# Patient Record
Sex: Male | Born: 1975
Health system: Southern US, Community
[De-identification: ages and names within clinical notes are randomized; demographics above are authoritative.]

## PROBLEM LIST (undated history)

## (undated) DIAGNOSIS — I447 Left bundle-branch block, unspecified: Secondary | ICD-10-CM

## (undated) DIAGNOSIS — I5022 Chronic systolic (congestive) heart failure: Secondary | ICD-10-CM

## (undated) DIAGNOSIS — I428 Other cardiomyopathies: Secondary | ICD-10-CM

---

## 2015-06-17 ENCOUNTER — Inpatient Hospital Stay (HOSPITAL_COMMUNITY)
Admission: EM | Admit: 2015-06-17 | Discharge: 2015-06-19 | DRG: 287 | Disposition: A | Discharge status: Home / Self Care | Payer: BLUE CROSS/BLUE SHIELD | Source: Other Acute Inpatient Hospital | Attending: Cardiology | Admitting: Cardiology

## 2015-06-17 DIAGNOSIS — I10 Essential (primary) hypertension: Secondary | ICD-10-CM | POA: Diagnosis present

## 2015-06-17 DIAGNOSIS — Z8249 Family history of ischemic heart disease and other diseases of the circulatory system: Secondary | ICD-10-CM

## 2015-06-17 DIAGNOSIS — F17200 Nicotine dependence, unspecified, uncomplicated: Secondary | ICD-10-CM | POA: Diagnosis present

## 2015-06-17 DIAGNOSIS — I447 Left bundle-branch block, unspecified: Secondary | ICD-10-CM | POA: Diagnosis present

## 2015-06-17 DIAGNOSIS — G4733 Obstructive sleep apnea (adult) (pediatric): Secondary | ICD-10-CM | POA: Diagnosis present

## 2015-06-17 DIAGNOSIS — Z7982 Long term (current) use of aspirin: Secondary | ICD-10-CM | POA: Diagnosis not present

## 2015-06-17 DIAGNOSIS — I248 Other forms of acute ischemic heart disease: Secondary | ICD-10-CM | POA: Diagnosis present

## 2015-06-17 DIAGNOSIS — I5021 Acute systolic (congestive) heart failure: Principal | ICD-10-CM | POA: Diagnosis present

## 2015-06-17 DIAGNOSIS — K219 Gastro-esophageal reflux disease without esophagitis: Secondary | ICD-10-CM | POA: Diagnosis present

## 2015-06-17 DIAGNOSIS — I509 Heart failure, unspecified: Secondary | ICD-10-CM

## 2015-06-17 DIAGNOSIS — Z720 Tobacco use: Secondary | ICD-10-CM

## 2015-06-17 MED ORDER — ASPIRIN EC 81 MG PO TBEC
81.0000 mg | DELAYED_RELEASE_TABLET | Freq: Every day | ORAL | Status: DC
  Administered 2015-06-18: 81 mg via ORAL
  Filled 2015-06-17: qty 1

## 2015-06-17 MED ORDER — FUROSEMIDE 10 MG/ML IJ SOLN
80.0000 mg | Freq: Two times a day (BID) | INTRAMUSCULAR | Status: DC
  Administered 2015-06-18 – 2015-06-19 (×3): 80 mg via INTRAVENOUS
  Filled 2015-06-17 (×3): qty 8

## 2015-06-17 MED ORDER — HEPARIN SODIUM (PORCINE) 5000 UNIT/ML IJ SOLN
5000.0000 [IU] | Freq: Three times a day (TID) | INTRAMUSCULAR | Status: DC
  Administered 2015-06-18 – 2015-06-19 (×3): 5000 [IU] via SUBCUTANEOUS
  Filled 2015-06-17 (×4): qty 1

## 2015-06-17 MED ORDER — NITROGLYCERIN 0.4 MG SL SUBL: 0.4000 mg | SUBLINGUAL_TABLET | SUBLINGUAL | Status: DC | PRN

## 2015-06-17 MED ORDER — POTASSIUM CHLORIDE CRYS ER 20 MEQ PO TBCR
40.0000 meq | EXTENDED_RELEASE_TABLET | Freq: Two times a day (BID) | ORAL | Status: DC
  Administered 2015-06-18 – 2015-06-19 (×4): 40 meq via ORAL
  Filled 2015-06-17 (×4): qty 2

## 2015-06-17 MED ORDER — ONDANSETRON HCL 4 MG/2ML IJ SOLN: 4.0000 mg | Freq: Four times a day (QID) | INTRAMUSCULAR | Status: DC | PRN

## 2015-06-17 MED ORDER — LOSARTAN POTASSIUM 25 MG PO TABS
25.0000 mg | ORAL_TABLET | Freq: Two times a day (BID) | ORAL | Status: DC
  Administered 2015-06-18 – 2015-06-19 (×4): 25 mg via ORAL
  Filled 2015-06-17 (×4): qty 1

## 2015-06-17 MED ORDER — ASPIRIN 81 MG PO CHEW: 324.0000 mg | CHEWABLE_TABLET | ORAL | Status: AC

## 2015-06-17 MED ORDER — ASPIRIN 300 MG RE SUPP: 300.0000 mg | RECTAL | Status: AC

## 2015-06-17 MED ORDER — NITROGLYCERIN IN D5W 200-5 MCG/ML-% IV SOLN: 0.0000 ug/min | INTRAVENOUS | Status: DC

## 2015-06-17 MED ORDER — ACETAMINOPHEN 325 MG PO TABS: 650.0000 mg | ORAL_TABLET | ORAL | Status: DC | PRN

## 2015-06-17 MED ORDER — ATORVASTATIN CALCIUM 40 MG PO TABS
40.0000 mg | ORAL_TABLET | Freq: Every day | ORAL | Status: DC
  Administered 2015-06-18: 40 mg via ORAL
  Filled 2015-06-17: qty 1

## 2015-06-17 NOTE — H&P (Addendum)
Physician History and Physical    Ethan York MRN: [id number] DOB/AGE: [birth year] 39 y.o. Admit date: 06/17/2015  Primary Cardiologist: New  HPI: 39 yo smoker with history of HTN and OSA presents with CHF.   Patient says that he has had exertional dyspnea for about a month.  Initially, he attributed it to "sinuses."  However, symptoms progressively worsened.  Currently, he is short of breath getting dressed or walking about 50 yards.  Sleeps on stomach so not sure about orthopnea but has had a couple of episodes consistent with PND. He has heartburn after large meals but no exertional chest pain.  He has occasional right-sided atypical chest pain.  Leg edema developed a couple of days ago.  He continues to smoke 1 ppd.  His brother (13 yo) is currently in the hospital after an MI.    Review of systems complete and found to be negative unless listed above   PMH: 1. HTN 2. OSA: Has not been using CPAP.  3. GERD  FH: - Brother with MI at 7 - Mother with MI in her 59s  Social History   Social History  . Marital Status: Married    Spouse Name: N/A  . Number of Children: N/A  . Years of Education: N/A   Occupational History  . Not on file.   Social History Main Topics  . Smoking status: 1 ppd  . Smokeless tobacco: Not on file  . Alcohol Use: Rare  . Drug Use: Not on file  . Sexual Activity: Not on file   Other Topics Concern  . Not on file   Social History Narrative  . Games developer, lives in [place].     Current Facility-Administered Medications  Medication Dose Route Frequency Provider Last Rate Last Dose  . acetaminophen (TYLENOL) tablet 650 mg  650 mg Oral Q4H PRN Laurey Morale, MD      . aspirin chewable tablet 324 mg  324 mg Oral NOW Laurey Morale, MD   324 mg at 06/18/15 0000   Or  . aspirin suppository 300 mg  300 mg Rectal NOW Laurey Morale, MD      . aspirin EC tablet 81 mg  81 mg Oral Daily Laurey Morale, MD      . atorvastatin (LIPITOR) tablet  40 mg  40 mg Oral q1800 Laurey Morale, MD      . furosemide (LASIX) injection 80 mg  80 mg Intravenous BID Laurey Morale, MD      . heparin injection 5,000 Units  5,000 Units Subcutaneous 3 times per day Laurey Morale, MD      . losartan (COZAAR) tablet 25 mg  25 mg Oral BID Laurey Morale, MD      . nitroGLYCERIN (NITROSTAT) SL tablet 0.4 mg  0.4 mg Sublingual Q5 Min x 3 PRN Laurey Morale, MD      . nitroGLYCERIN 50 mg in dextrose 5 % 250 mL (0.2 mg/mL) infusion  0-200 mcg/min Intravenous Titrated Laurey Morale, MD      . ondansetron [facility]) injection 4 mg  4 mg Intravenous Q6H PRN Laurey Morale, MD      . potassium chloride SA (K-DUR,KLOR-CON) CR tablet 40 mEq  40 mEq Oral BID Laurey Morale, MD        Physical Exam: Blood pressure 141/103, pulse 100, temperature 97.7 F (36.5 C), temperature source Oral, height  (2.007 m), weight 338 lb 6.5 oz (153.5  kg), SpO2 97 %.  General: NAD Neck: JVP 14-16 cm, no thyromegaly or thyroid nodule.  Lungs: Mild crackles at bases bilaterally. CV: Nondisplaced PMI.  Heart regular S1/S2, no S3/S4, no murmur.  1+ edema to knees bilaterally.  No carotid bruit.  Normal pedal pulses.  Abdomen: Soft, nontender, no hepatosplenomegaly, no distention.  Skin: Intact without lesions or rashes.  Neurologic: Alert and oriented x 3.  Psych: Normal affect. Extremities: No clubbing or cyanosis.  HEENT: Normal.   Labs: nt-proBNP 3330 Na 137 K 3.5 Creatinine 1.06 HCT 40.4   Radiology: - CXR: Mild cardiomegaly  EKG: Sinus tachy, LBBB  ASSESSMENT AND PLAN: 39 yo smoker with history of HTN and OSA presents with CHF.   1. Acute CHF: I am concerned that this is acute systolic CHF.  He is volume overloaded on exam with NYHA class III symptoms. [facility]NP at [facility] was markedly elevated. Etiology uncertain but CAD high on differential.  He has multiple RFs for CAD (family history, HTN, smoking).  He has not had chest pain concerning for ischemia.   He has had long-standing HTN, which could cause a cardiomyopathy.  Cannot rule out viral myocarditis.  - Lasix 80 mg IV bid with KCl 40 bid.  - losartan 25 mg bid.  - NTG gtt at 20 mcg/min, titrate up as able to keep SBP < 130/90.  - Echo  - He will need eventual LHC/RHC. I will diurese him a bit first => possibly on Friday.  2. CAD: Multiple RFs for CAD as above.  No definite ischemic chest pain.  Cannot rule out ischemic cardiomyopathy with decompensated CHF.   - Eventual cath, as above.  - I will cycle troponin to rule out ACS.  - ASA 81 daily and will use atorvastatin for now.  3. OSA: Will provide him with qhs CPAP.  4. HTN: Control BP with NTG gtt and losartan for now.  5. Smoking: I strongly advised him to quit.   Signed: [name] 06/17/2015, 11:59 PM

## 2015-06-18 ENCOUNTER — Ambulatory Visit (HOSPITAL_COMMUNITY): Payer: BLUE CROSS/BLUE SHIELD

## 2015-06-18 ENCOUNTER — Encounter (HOSPITAL_COMMUNITY)
Admission: EM | Disposition: A | Discharge status: Home / Self Care | Payer: Self-pay | Source: Other Acute Inpatient Hospital | Attending: Cardiology

## 2015-06-18 ENCOUNTER — Encounter (HOSPITAL_COMMUNITY): Payer: Self-pay

## 2015-06-18 DIAGNOSIS — I509 Heart failure, unspecified: Secondary | ICD-10-CM

## 2015-06-18 DIAGNOSIS — R0600 Dyspnea, unspecified: Secondary | ICD-10-CM

## 2015-06-18 HISTORY — PX: CARDIAC CATHETERIZATION: SHX172

## 2015-06-18 LAB — LIPID PANEL
CHOLESTEROL: 114 mg/dL (ref 0–200)
HDL: 20 mg/dL — ABNORMAL LOW (ref >40)
LDL CALC: 84 mg/dL (ref 0–99)
TRIGLYCERIDES: 48 mg/dL (ref <150)
Total CHOL/HDL Ratio: 5.7 RATIO
VLDL: 10 mg/dL (ref 0–40)

## 2015-06-18 LAB — TROPONIN I
TROPONIN I: 0.04 ng/mL — AB (ref <0.031)
TROPONIN I: 0.03 ng/mL (ref <0.031)
Troponin I: 0.04 ng/mL — ABNORMAL HIGH (ref <0.031)

## 2015-06-18 LAB — CBC
HEMATOCRIT: 37.9 % — AB (ref 39.0–52.0)
HEMATOCRIT: 39.5 % (ref 39.0–52.0)
HEMOGLOBIN: 12 g/dL — AB (ref 13.0–17.0)
HEMOGLOBIN: 13 g/dL (ref 13.0–17.0)
MCH: 28.7 pg (ref 26.0–34.0)
MCH: 29.8 pg (ref 26.0–34.0)
MCHC: 31.7 g/dL (ref 30.0–36.0)
MCHC: 32.9 g/dL (ref 30.0–36.0)
MCV: 90.6 fL (ref 78.0–100.0)
MCV: 90.7 fL (ref 78.0–100.0)
Platelets: 261 10*3/uL (ref 150–400)
Platelets: 237 10*3/uL (ref 150–400)
RBC: 4.36 MIL/uL (ref 4.22–5.81)
RBC: 4.18 MIL/uL — ABNORMAL LOW (ref 4.22–5.81)
RDW: 14.8 % (ref 11.5–15.5)
RDW: 14.8 % (ref 11.5–15.5)
WBC: 7.3 10*3/uL (ref 4.0–10.5)
WBC: 8.6 10*3/uL (ref 4.0–10.5)

## 2015-06-18 LAB — BASIC METABOLIC PANEL
Anion gap: 7 (ref 5–15)
Anion gap: 9 (ref 5–15)
BUN: 7 mg/dL (ref 6–20)
BUN: 7 mg/dL (ref 6–20)
CALCIUM: 9 mg/dL (ref 8.9–10.3)
CHLORIDE: 102 mmol/L (ref 101–111)
CO2: 28 mmol/L (ref 22–32)
CO2: 29 mmol/L (ref 22–32)
CREATININE: 1.04 mg/dL (ref 0.61–1.24)
CREATININE: 1.1 mg/dL (ref 0.61–1.24)
Calcium: 8.8 mg/dL — ABNORMAL LOW (ref 8.9–10.3)
Chloride: 104 mmol/L (ref 101–111)
GFR calc non Af Amer: >60 mL/min (ref >60)
GFR calc non Af Amer: >60 mL/min (ref >60)
Glucose, Bld: 115 mg/dL — ABNORMAL HIGH (ref 65–99)
Glucose, Bld: 99 mg/dL (ref 65–99)
POTASSIUM: 3.7 mmol/L (ref 3.5–5.1)
Potassium: 3.5 mmol/L (ref 3.5–5.1)
SODIUM: 139 mmol/L (ref 135–145)
Sodium: 140 mmol/L (ref 135–145)

## 2015-06-18 LAB — BRAIN NATRIURETIC PEPTIDE: B Natriuretic Peptide: 884.2 pg/mL — ABNORMAL HIGH (ref 0.0–100.0)

## 2015-06-18 LAB — POCT I-STAT 3, VENOUS BLOOD GAS (G3P V)
Acid-Base Excess: 4 mmol/L — ABNORMAL HIGH (ref 0.0–2.0)
Acid-Base Excess: 5 mmol/L — ABNORMAL HIGH (ref 0.0–2.0)
BICARBONATE: 29.1 meq/L — AB (ref 20.0–24.0)
Bicarbonate: 28.4 mEq/L — ABNORMAL HIGH (ref 20.0–24.0)
O2 SAT: 60 %
O2 Saturation: 63 %
PCO2 VEN: 39.2 mmHg — AB (ref 45.0–50.0)
PH VEN: 7.467 — AB (ref 7.250–7.300)
PH VEN: 7.476 — AB (ref 7.250–7.300)
TCO2: 30 mmol/L (ref 0–100)
TCO2: 30 mmol/L (ref 0–100)
pCO2, Ven: 39.4 mmHg — ABNORMAL LOW (ref 45.0–50.0)
pO2, Ven: 29 mmHg — CL (ref 30.0–45.0)
pO2, Ven: 31 mmHg (ref 30.0–45.0)

## 2015-06-18 LAB — CREATININE, SERUM
Creatinine, Ser: 1.11 mg/dL (ref 0.61–1.24)
GFR calc Af Amer: >60 mL/min (ref >60)
GFR calc non Af Amer: >60 mL/min (ref >60)

## 2015-06-18 LAB — MRSA PCR SCREENING: MRSA by PCR: NEGATIVE

## 2015-06-18 LAB — TSH: TSH: 1.399 u[IU]/mL (ref 0.350–4.500)

## 2015-06-18 SURGERY — RIGHT/LEFT HEART CATH AND CORONARY ANGIOGRAPHY

## 2015-06-18 MED ORDER — SPIRONOLACTONE 25 MG PO TABS
12.5000 mg | ORAL_TABLET | Freq: Every day | ORAL | Status: DC
  Administered 2015-06-18 – 2015-06-19 (×2): 12.5 mg via ORAL
  Filled 2015-06-18 (×2): qty 1

## 2015-06-18 MED ORDER — FENTANYL CITRATE (PF) 100 MCG/2ML IJ SOLN
INTRAMUSCULAR | Status: AC
  Filled 2015-06-18: qty 2

## 2015-06-18 MED ORDER — SODIUM CHLORIDE 0.9 % IV SOLN: 250.0000 mL | INTRAVENOUS | Status: DC | PRN

## 2015-06-18 MED ORDER — PERFLUTREN LIPID MICROSPHERE
1.0000 mL | INTRAVENOUS | Status: AC | PRN
  Administered 2015-06-18: 2 mL via INTRAVENOUS
  Filled 2015-06-18: qty 10

## 2015-06-18 MED ORDER — HEPARIN SODIUM (PORCINE) 1000 UNIT/ML IJ SOLN
INTRAMUSCULAR | Status: DC | PRN
  Administered 2015-06-18: 6000 [IU] via INTRAVENOUS

## 2015-06-18 MED ORDER — SODIUM CHLORIDE 0.9 % IV SOLN
INTRAVENOUS | Status: DC | PRN
  Administered 2015-06-18: 20 mL/h via INTRAVENOUS

## 2015-06-18 MED ORDER — ALPRAZOLAM 0.5 MG PO TABS
0.5000 mg | ORAL_TABLET | Freq: Two times a day (BID) | ORAL | Status: DC | PRN
  Administered 2015-06-18: 0.5 mg via ORAL
  Filled 2015-06-18: qty 1

## 2015-06-18 MED ORDER — HEPARIN (PORCINE) IN NACL 2-0.9 UNIT/ML-% IJ SOLN
INTRAMUSCULAR | Status: AC
  Filled 2015-06-18: qty 1000

## 2015-06-18 MED ORDER — SODIUM CHLORIDE 0.9 % IJ SOLN
3.0000 mL | Freq: Two times a day (BID) | INTRAMUSCULAR | Status: DC
  Administered 2015-06-18 – 2015-06-19 (×3): 3 mL via INTRAVENOUS

## 2015-06-18 MED ORDER — ACETAMINOPHEN 325 MG PO TABS: 650.0000 mg | ORAL_TABLET | ORAL | Status: DC | PRN

## 2015-06-18 MED ORDER — SODIUM CHLORIDE 0.9 % IJ SOLN: 3.0000 mL | INTRAMUSCULAR | Status: DC | PRN

## 2015-06-18 MED ORDER — ONDANSETRON HCL 4 MG/2ML IJ SOLN: 4.0000 mg | Freq: Four times a day (QID) | INTRAMUSCULAR | Status: DC | PRN

## 2015-06-18 MED ORDER — HEPARIN (PORCINE) IN NACL 2-0.9 UNIT/ML-% IJ SOLN
INTRAMUSCULAR | Status: DC | PRN
  Administered 2015-06-18: 12:00:00

## 2015-06-18 MED ORDER — VERAPAMIL HCL 2.5 MG/ML IV SOLN
INTRAVENOUS | Status: AC
  Filled 2015-06-18: qty 2

## 2015-06-18 MED ORDER — IOHEXOL 350 MG/ML SOLN
INTRAVENOUS | Status: DC | PRN
  Administered 2015-06-18: 90 mL via INTRAVENOUS

## 2015-06-18 MED ORDER — ASPIRIN 81 MG PO CHEW: 81.0000 mg | CHEWABLE_TABLET | ORAL | Status: AC

## 2015-06-18 MED ORDER — VERAPAMIL HCL 2.5 MG/ML IV SOLN
INTRAVENOUS | Status: DC | PRN
  Administered 2015-06-18: 10 mL via INTRA_ARTERIAL

## 2015-06-18 MED ORDER — NICOTINE 21 MG/24HR TD PT24
21.0000 mg | MEDICATED_PATCH | Freq: Every day | TRANSDERMAL | Status: DC
  Administered 2015-06-18 – 2015-06-19 (×2): 21 mg via TRANSDERMAL
  Filled 2015-06-18 (×2): qty 1

## 2015-06-18 MED ORDER — MIDAZOLAM HCL 2 MG/2ML IJ SOLN
INTRAMUSCULAR | Status: AC
  Filled 2015-06-18: qty 2

## 2015-06-18 MED ORDER — ATROPINE SULFATE 0.1 MG/ML IJ SOLN
INTRAMUSCULAR | Status: AC
  Filled 2015-06-18: qty 10

## 2015-06-18 MED ORDER — MIDAZOLAM HCL 2 MG/2ML IJ SOLN
INTRAMUSCULAR | Status: DC | PRN
  Administered 2015-06-18: 2 mg via INTRAVENOUS

## 2015-06-18 MED ORDER — FENTANYL CITRATE (PF) 100 MCG/2ML IJ SOLN
INTRAMUSCULAR | Status: DC | PRN
  Administered 2015-06-18: 50 ug via INTRAVENOUS

## 2015-06-18 MED ORDER — SODIUM CHLORIDE 0.9 % IJ SOLN
3.0000 mL | Freq: Two times a day (BID) | INTRAMUSCULAR | Status: DC
  Administered 2015-06-18: 3 mL via INTRAVENOUS

## 2015-06-18 MED ORDER — SODIUM CHLORIDE 0.9 % IV SOLN: INTRAVENOUS | Status: DC

## 2015-06-18 MED ORDER — HEPARIN SODIUM (PORCINE) 1000 UNIT/ML IJ SOLN
INTRAMUSCULAR | Status: AC
  Filled 2015-06-18: qty 1

## 2015-06-18 MED ORDER — LIDOCAINE HCL (PF) 1 % IJ SOLN
INTRAMUSCULAR | Status: AC
  Filled 2015-06-18: qty 30

## 2015-06-18 MED ORDER — LIDOCAINE HCL (PF) 1 % IJ SOLN
INTRAMUSCULAR | Status: DC | PRN
  Administered 2015-06-18: 2 mL
  Administered 2015-06-18: 1 mL

## 2015-06-18 MED ORDER — SODIUM CHLORIDE 0.9 % IJ SOLN
3.0000 mL | Freq: Two times a day (BID) | INTRAMUSCULAR | Status: DC
  Administered 2015-06-19: 3 mL via INTRAVENOUS

## 2015-06-18 SURGICAL SUPPLY — 13 items
CATH BALLN WEDGE 5F 110CM (CATHETERS) ×3 IMPLANT
CATH INFINITI 5 FR 3DRC (CATHETERS) ×3 IMPLANT
CATH INFINITI 5 FR JL3.5 (CATHETERS) ×3 IMPLANT
CATH INFINITI 5FR ANG PIGTAIL (CATHETERS) ×3 IMPLANT
CATH INFINITI JR4 5F (CATHETERS) ×3 IMPLANT
DEVICE RAD COMP TR BAND LRG (VASCULAR PRODUCTS) ×3 IMPLANT
GLIDESHEATH SLEND SS 6F .021 (SHEATH) ×3 IMPLANT
KIT HEART LEFT (KITS) ×3 IMPLANT
PACK CARDIAC CATHETERIZATION (CUSTOM PROCEDURE TRAY) ×3 IMPLANT
SHEATH FAST CATH BRACH 5F 5CM (SHEATH) ×3 IMPLANT
TRANSDUCER W/STOPCOCK (MISCELLANEOUS) ×3 IMPLANT
TUBING CIL FLEX 10 FLL-RA (TUBING) ×3 IMPLANT
WIRE SAFE-T 1.5MM-J .035X260CM (WIRE) ×3 IMPLANT

## 2015-06-18 NOTE — Care Management Note (Signed)
Case Management Note  Patient Details  Name: Ethan York MRN: [id number] Date of Birth: [birth year]  Subjective/Objective:       Adm w heart failure             Action/Plan:lives w fam   Expected Discharge Date:                  Expected Discharge Plan:  Home/Self Care  In-House Referral:     Discharge planning Services     Post Acute Care Choice:    Choice offered to:     DME Arranged:    DME Agency:     HH Arranged:    HH Agency:     Status of Service:     Medicare Important Message Given:    Date Medicare IM Given:    Medicare IM give by:    Date Additional Medicare IM Given:    Additional Medicare Important Message give by:     If discussed at Long Length of Stay Meetings, dates discussed:    Additional Comments: ur review done  Hanley Hays, RN 06/18/2015, 7:31 AM

## 2015-06-18 NOTE — Progress Notes (Signed)
Echocardiogram 2D Echocardiogram with Definity has been performed.  Ethan York 06/18/2015, 4:39 PM

## 2015-06-18 NOTE — Interval H&P Note (Signed)
History and Physical Interval Note:  06/18/2015 11:13 AM  Ethan York  has presented today for surgery, with the diagnosis of cp  The various methods of treatment have been discussed with the patient and family. After consideration of risks, benefits and other options for treatment, the patient has consented to  Procedure(s): Right/Left Heart Cath and Coronary Angiography (N/A) as a surgical intervention .  The patient's history has been reviewed, patient examined, no change in status, stable for surgery.  I have reviewed the patient's chart and labs.  Questions were answered to the patient's satisfaction.     [place] [employer]

## 2015-06-18 NOTE — Progress Notes (Signed)
Patient ID: Ethan York, male   DOB: 10/22/1975, 39 y.o.   MRN: [id number]   SUBJECTIVE: No dyspnea at rest.  He diuresed a lot last night.  Still leg edema.    Scheduled Meds: . aspirin  324 mg Oral NOW   Or  . aspirin  300 mg Rectal NOW  . aspirin EC  81 mg Oral Daily  . atorvastatin  40 mg Oral q1800  . furosemide  80 mg Intravenous BID  . heparin  5,000 Units Subcutaneous 3 times per day  . losartan  25 mg Oral BID  . potassium chloride  40 mEq Oral BID  . spironolactone  12.5 mg Oral Daily   Continuous Infusions: . nitroGLYCERIN     PRN Meds:.acetaminophen, nitroGLYCERIN, ondansetron (ZOFRAN) IV    Filed Vitals:   06/18/15 0300 06/18/15 0322 06/18/15 0400 06/18/15 0600  BP: 117/76 116/82 128/84 130/85  Pulse: 82 87    Temp:  98.1 F (36.7 C)    TempSrc:  Oral    Height:      Weight:  338 lb 6.5 oz (153.5 kg)    SpO2: 96% 93%      Intake/Output Summary (Last 24 hours) at 06/18/15 0728 Last data filed at 06/18/15 0500  Gross per 24 hour  Intake    450 ml  Output   6150 ml  Net  -5700 ml    LABS: Basic Metabolic Panel:  Recent Labs  06/18/15 0040  CREATININE 1.11   Liver Function Tests: No results for input(s): AST, ALT, ALKPHOS, BILITOT, PROT, ALBUMIN in the last 72 hours. No results for input(s): LIPASE, AMYLASE in the last 72 hours. CBC:  Recent Labs  06/18/15 0040 06/18/15 0654  WBC 8.6 7.3  HGB 13.0 12.0*  HCT 39.5 37.9*  MCV 90.6 90.7  PLT 261 237   Cardiac Enzymes:  Recent Labs  06/18/15 0040  TROPONINI 0.04*   BNP: Invalid input(s): POCBNP D-Dimer: No results for input(s): DDIMER in the last 72 hours. Hemoglobin A1C: No results for input(s): HGBA1C in the last 72 hours. Fasting Lipid Panel: No results for input(s): CHOL, HDL, LDLCALC, TRIG, CHOLHDL, LDLDIRECT in the last 72 hours. Thyroid Function Tests:  Recent Labs  06/18/15 0040  TSH 1.399   Anemia Panel: No results for input(s): VITAMINB12, FOLATE, FERRITIN, TIBC,  IRON, RETICCTPCT in the last 72 hours.  RADIOLOGY: No results found.  PHYSICAL EXAM General: NAD Neck: JVP 12 cm, no thyromegaly or thyroid nodule.  Lungs: Clear to auscultation bilaterally with normal respiratory effort. CV: Nondisplaced PMI.  Heart regular S1/S2, no S3/S4, no murmur.  1+ edema 1/2 to knees bilaterally.  No carotid bruit.  Normal pedal pulses.  Abdomen: Soft, nontender, no hepatosplenomegaly, no distention.  Neurologic: Alert and oriented x 3.  Psych: Normal affect. Extremities: No clubbing or cyanosis.   TELEMETRY: Reviewed telemetry pt in NSR with LBBB  ASSESSMENT AND PLAN: 39 yo smoker with history of HTN and OSA presents with CHF.  1. Acute CHF: I am concerned that this is acute systolic CHF. He is volume overloaded on exam with NYHA class III symptoms. NT-proBNP at [facility] was markedly elevated. Etiology uncertain but CAD high on differential. He has multiple RFs for CAD (family history, HTN, smoking). He has not had chest pain concerning for ischemia. He has had long-standing HTN, which could cause a cardiomyopathy. Cannot rule out viral myocarditis. He diuresed well last night but is still volume overloaded. - Continue Lasix 80 mg IV bid with   KCl 40 bid.  - losartan 25 mg bid.  - Add spironolactone 12.5 daily.  - Echo  - Very good diuresis so will see if we can go ahead and do LHC/RHC today.   2. CAD: Multiple RFs for CAD as above. No definite ischemic chest pain. Cannot rule out ischemic cardiomyopathy with decompensated CHF. Mild troponin elevation most likely is demand ischemia from volume overload.  - Coronary angiography, as above.  - ASA 81 daily and will use atorvastatin for now.  3. OSA: Will provide him with qhs CPAP.  4. HTN: BP controlled.  5. Smoking: I strongly advised him to quit.   [name] 06/18/2015 7:34 AM

## 2015-06-18 NOTE — Progress Notes (Signed)
Pt has order for CPAP. I spoke with patient prior to setup and patient did not seem very interested in wearing but patients wife told him he needed to wear. Pt stated he wore nasal mask and was unsure of setting. I brought back CPAP and patient said he was not ready for it at this time. I started him on auto-titration setting Hi: 18/ Lo: 8. Pt currently wearing 2L [place]. I told him to let us know when he was ready because we needed to bleed in oxygen. Pt understands. RT will monitor

## 2015-06-18 NOTE — Progress Notes (Signed)
Pt has CPAP at bedside. Pt is able to put on CPAP when ready. RT will monitor.

## 2015-06-18 NOTE — H&P (View-Only) (Signed)
Patient ID: Ethan York, male   DOB: March 08, 1976, 39 y.o.   MRN: [id number]   SUBJECTIVE: No dyspnea at rest.  He diuresed a lot last night.  Still leg edema.    Scheduled Meds: . aspirin  324 mg Oral NOW   Or  . aspirin  300 mg Rectal NOW  . aspirin EC  81 mg Oral Daily  . atorvastatin  40 mg Oral q1800  . furosemide  80 mg Intravenous BID  . heparin  5,000 Units Subcutaneous 3 times per day  . losartan  25 mg Oral BID  . potassium chloride  40 mEq Oral BID  . spironolactone  12.5 mg Oral Daily   Continuous Infusions: . nitroGLYCERIN     PRN Meds:.acetaminophen, nitroGLYCERIN, ondansetron (ZOFRAN) IV    Filed Vitals:   06/18/15 0300 06/18/15 0322 06/18/15 0400 06/18/15 0600  BP: 117/76 116/82 128/84 130/85  Pulse: 82 87    Temp:  98.1 F (36.7 C)    TempSrc:  Oral    Height:      Weight:  338 lb 6.5 oz (153.5 kg)    SpO2: 96% 93%      Intake/Output Summary (Last 24 hours) at 06/18/15 0728 Last data filed at 06/18/15 0500  Gross per 24 hour  Intake    450 ml  Output   6150 ml  Net  -5700 ml    LABS: Basic Metabolic Panel:  Recent Labs  40/98/11 0040  CREATININE 1.11   Liver Function Tests: No results for input(s): AST, ALT, ALKPHOS, BILITOT, PROT, ALBUMIN in the last 72 hours. No results for input(s): LIPASE, AMYLASE in the last 72 hours. CBC:  Recent Labs  06/18/15 0040 06/18/15 0654  WBC 8.6 7.3  HGB 13.0 12.0*  HCT 39.5 37.9*  MCV 90.6 90.7  PLT 261 237   Cardiac Enzymes:  Recent Labs  06/18/15 0040  TROPONINI 0.04*   BNP: Invalid input(s): POCBNP D-Dimer: No results for input(s): DDIMER in the last 72 hours. Hemoglobin A1C: No results for input(s): HGBA1C in the last 72 hours. Fasting Lipid Panel: No results for input(s): CHOL, HDL, LDLCALC, TRIG, CHOLHDL, LDLDIRECT in the last 72 hours. Thyroid Function Tests:  Recent Labs  06/18/15 0040  TSH 1.399   Anemia Panel: No results for input(s): VITAMINB12, FOLATE, FERRITIN, TIBC,  IRON, RETICCTPCT in the last 72 hours.  RADIOLOGY: No results found.  PHYSICAL EXAM General: NAD Neck: JVP 12 cm, no thyromegaly or thyroid nodule.  Lungs: Clear to auscultation bilaterally with normal respiratory effort. CV: Nondisplaced PMI.  Heart regular S1/S2, no S3/S4, no murmur.  1+ edema 1/2 to knees bilaterally.  No carotid bruit.  Normal pedal pulses.  Abdomen: Soft, nontender, no hepatosplenomegaly, no distention.  Neurologic: Alert and oriented x 3.  Psych: Normal affect. Extremities: No clubbing or cyanosis.   TELEMETRY: Reviewed telemetry pt in NSR with LBBB  ASSESSMENT AND PLAN: 39 yo smoker with history of HTN and OSA presents with CHF.  1. Acute CHF: I am concerned that this is acute systolic CHF. He is volume overloaded on exam with NYHA class III symptoms. NT[facility]proBNP at [facility] was markedly elevated. Etiology uncertain but CAD high on differential. He has multiple RFs for CAD (family history, HTN, smoking). He has not had chest pain concerning for ischemia. He has had long-standing HTN, which could cause a cardiomyopathy. Cannot rule out viral myocarditis. He diuresed well last night but is still volume overloaded. - Continue Lasix 80 mg IV bid with  KCl 40 bid.  - losartan 25 mg bid.  - Add spironolactone 12.5 daily.  - Echo  - Very good diuresis so will see if we can go ahead and do LHC/RHC today.   2. CAD: Multiple RFs for CAD as above. No definite ischemic chest pain. Cannot rule out ischemic cardiomyopathy with decompensated CHF. Mild troponin elevation most likely is demand ischemia from volume overload.  - Coronary angiography, as above.  - ASA 81 daily and will use atorvastatin for now.  3. OSA: Will provide him with qhs CPAP.  4. HTN: BP controlled.  5. Smoking: I strongly advised him to quit.   [name] 06/18/2015 7:34 AM

## 2015-06-19 ENCOUNTER — Encounter (HOSPITAL_COMMUNITY): Payer: Self-pay

## 2015-06-19 DIAGNOSIS — I5021 Acute systolic (congestive) heart failure: Principal | ICD-10-CM

## 2015-06-19 LAB — BASIC METABOLIC PANEL
ANION GAP: 6 (ref 5–15)
BUN: 8 mg/dL (ref 6–20)
CHLORIDE: 97 mmol/L — AB (ref 101–111)
CO2: 34 mmol/L — ABNORMAL HIGH (ref 22–32)
Calcium: 9.2 mg/dL (ref 8.9–10.3)
Creatinine, Ser: 1.19 mg/dL (ref 0.61–1.24)
GFR calc non Af Amer: >60 mL/min (ref >60)
Glucose, Bld: 99 mg/dL (ref 65–99)
POTASSIUM: 3.3 mmol/L — AB (ref 3.5–5.1)
SODIUM: 137 mmol/L (ref 135–145)

## 2015-06-19 LAB — CBC
HCT: 40.1 % (ref 39.0–52.0)
HEMOGLOBIN: 12.7 g/dL — AB (ref 13.0–17.0)
MCH: 28.9 pg (ref 26.0–34.0)
MCHC: 31.7 g/dL (ref 30.0–36.0)
MCV: 91.1 fL (ref 78.0–100.0)
Platelets: 255 10*3/uL (ref 150–400)
RBC: 4.4 MIL/uL (ref 4.22–5.81)
RDW: 14.8 % (ref 11.5–15.5)
WBC: 7.3 10*3/uL (ref 4.0–10.5)

## 2015-06-19 LAB — MAGNESIUM: MAGNESIUM: 2.1 mg/dL (ref 1.7–2.4)

## 2015-06-19 LAB — HEMOGLOBIN A1C
HEMOGLOBIN A1C: 5.6 % (ref 4.8–5.6)
MEAN PLASMA GLUCOSE: 114 mg/dL

## 2015-06-19 MED ORDER — PNEUMOCOCCAL VAC POLYVALENT 25 MCG/0.5ML IJ INJ: 0.5000 mL | INJECTION | INTRAMUSCULAR | Status: DC

## 2015-06-19 MED ORDER — CARVEDILOL 3.125 MG PO TABS
3.1250 mg | ORAL_TABLET | Freq: Two times a day (BID) | ORAL | Status: DC
  Administered 2015-06-19: 3.125 mg via ORAL
  Filled 2015-06-19: qty 1

## 2015-06-19 MED ORDER — INFLUENZA VAC SPLIT QUAD 0.5 ML IM SUSY
0.5000 mL | PREFILLED_SYRINGE | INTRAMUSCULAR | Status: AC
  Administered 2015-06-19: 0.5 mL via INTRAMUSCULAR
  Filled 2015-06-19: qty 0.5

## 2015-06-19 MED ORDER — NICOTINE 21 MG/24HR TD PT24: 21.0000 mg | MEDICATED_PATCH | Freq: Every day | TRANSDERMAL | Status: DC

## 2015-06-19 MED ORDER — LOSARTAN POTASSIUM 25 MG PO TABS: 25.0000 mg | ORAL_TABLET | Freq: Two times a day (BID) | ORAL | Status: DC

## 2015-06-19 MED ORDER — CARVEDILOL 3.125 MG PO TABS: 3.1250 mg | ORAL_TABLET | Freq: Two times a day (BID) | ORAL | Status: DC

## 2015-06-19 MED ORDER — POTASSIUM CHLORIDE CRYS ER 20 MEQ PO TBCR: 20.0000 meq | EXTENDED_RELEASE_TABLET | Freq: Every day | ORAL | Status: DC

## 2015-06-19 MED ORDER — INFLUENZA VAC SPLIT QUAD 0.5 ML IM SUSY: 0.5000 mL | PREFILLED_SYRINGE | INTRAMUSCULAR | Status: DC

## 2015-06-19 MED ORDER — FUROSEMIDE 40 MG PO TABS: 40.0000 mg | ORAL_TABLET | Freq: Two times a day (BID) | ORAL | Status: DC

## 2015-06-19 MED ORDER — PNEUMOCOCCAL VAC POLYVALENT 25 MCG/0.5ML IJ INJ
0.5000 mL | INJECTION | INTRAMUSCULAR | Status: AC
  Administered 2015-06-19: 0.5 mL via INTRAMUSCULAR
  Filled 2015-06-19: qty 0.5

## 2015-06-19 MED ORDER — POTASSIUM CHLORIDE CRYS ER 20 MEQ PO TBCR: 40.0000 meq | EXTENDED_RELEASE_TABLET | Freq: Once | ORAL | Status: DC

## 2015-06-19 MED ORDER — SPIRONOLACTONE 25 MG PO TABS: 12.5000 mg | ORAL_TABLET | Freq: Every day | ORAL | Status: DC

## 2015-06-19 NOTE — Discharge Summary (Signed)
Advanced Heart Failure Team  Discharge Summary   Patient ID: Ethan York MRN: [id number], DOB/AGE: 03-06-1976 39 y.o. Admit date: 06/17/2015 D/C date:     06/19/2015   Primary Discharge Diagnose 1. Acute Systolic Heart Failure  06/17/2016 ECHO EF 20-25% 2. OSA 3. HTN 4. Smoker 5. LBBB   Hospital Course:  Mr Horng is a 39 year old with history of HTN, OSA, and smoker. Strong history of CAD-brother died of MI at 87 and his Mother had MI in her 44s. Admitted with increased dyspnea and chest pain.  ECHO performed and showed reduce EF 20-25%. RHC/LHC performed and showed elevated filling pressures and normal cors. Diuresed with IV lasix and transitioned to po lasix 40 mg twice a day. Overall he diuresed 21 pounds. He was placed on carvedilol, losartan, spironolactone prior to discharge. He was provided medications through the [facility] program.   Prior to admit he smoked 1-2 packs per day. He was counseled to stop smoking and will continue nicoderm patch for now. Plan to follow closely in the HF clinic. Plan to check BMET at first office visit. Will need to refer to HFSW for assistance with insurance.   RHC/LHC 06/18/2015 RA mean 9 RV 48/12 PA 50/29, mean 37 PCWP mean 22 LV 111/30 AO 115/80 Oxygen saturations: PA 62% AO 95% Cardiac Output (Fick) 7.04  Cardiac Index (Fick) 2.47 PVR 2.13 LHC norm cors. Discharge Weight: 317 pounds.  Discharge Vitals: Blood pressure 127/89, pulse 87, temperature 99.2 F (37.3 C), temperature source Oral, resp. rate 18, height 6\' 7"  (2.007 m), weight 317 lb 0.3 oz (143.8 kg), SpO2 97 %.  Labs: Lab Results  Component Value Date   WBC 7.3 06/19/2015   HGB 12.7* 06/19/2015   HCT 40.1 06/19/2015   MCV 91.1 06/19/2015   PLT 255 06/19/2015    Recent Labs Lab 06/19/15 0825  NA 137  K 3.3*  CL 97*  CO2 34*  BUN 8  CREATININE 1.19  CALCIUM 9.2  GLUCOSE 99   Lab Results  Component Value Date   CHOL 114 06/18/2015   HDL 20* 06/18/2015   LDLCALC 84 06/18/2015   TRIG 48 06/18/2015   BNP (last 3 results)  Recent Labs  06/18/15 0040  BNP 884.2*    ProBNP (last 3 results) No results for input(s): PROBNP in the last 8760 hours.   Diagnostic Studies/Procedures   No results found.  Discharge Medications     Medication List    STOP taking these medications        aspirin-sod bicarb-citric acid 325 MG Tbef tablet  Commonly known as:  ALKA-SELTZER     GOODYS BODY PAIN PO     losartan-hydrochlorothiazide 100-12.5 MG tablet  Commonly known as:  HYZAAR      TAKE these medications        carvedilol 3.125 MG tablet  Commonly known as:  COREG  Take 1 tablet (3.125 mg total) by mouth 2 (two) times daily with a meal.     furosemide 40 MG tablet  Commonly known as:  LASIX  Take 1 tablet (40 mg total) by mouth 2 (two) times daily.  Start taking on:  06/20/2015     losartan 25 MG tablet  Commonly known as:  COZAAR  Take 1 tablet (25 mg total) by mouth 2 (two) times daily.     nicotine 21 mg/24hr patch  Commonly known as:  NICODERM CQ - dosed in mg/24 hours  Place 1 patch (21 mg total) onto the  skin daily.     potassium chloride SA 20 MEQ tablet  Commonly known as:  K-DUR,KLOR-CON  Take 1 tablet (20 mEq total) by mouth daily.     spironolactone 25 MG tablet  Commonly known as:  ALDACTONE  Take 0.5 tablets (12.5 mg total) by mouth daily.        Disposition   The patient will be discharged in stable condition to home.     Discharge Instructions    Diet - low sodium heart healthy    Complete by:  As directed      Heart Failure patients record your daily weight using the same scale at the same time of day    Complete by:  As directed      Increase activity slowly    Complete by:  As directed           Follow-up Information    Follow up with [name], MD On 07/03/2015.   Specialty:  Cardiology   Why:  08:40 [address] [id number]    Contact information:   [address] [place]  [place] [address] [phone]         Duration of Discharge Encounter: Greater than 35 minutes   Signed, Aaralynn Shepheard NP-C  06/19/2015, 9:46 AM

## 2015-06-19 NOTE — Progress Notes (Signed)
Tonye Becket NP called about K 3.3. given now and NP ordered additional dose tonight. Patient instructed of this on D/c instructions. Patient with no complaints at the current time. PIVs d/c'd catheter tip intact. Pt taken out to private vehicle with spouse via WC.

## 2015-06-19 NOTE — Care Management Note (Signed)
Case Management Note  Patient Details  Name: Ethan York MRN: [id number] Date of Birth: 01/18/1976  Subjective/Objective:     Adm w heart failure               Action/Plan: lives w wife, pt to follow up w chf clinic. Gave [facility] inform [facility] and [facility]. Gave pt match letter for 30days of meds. No ins or pcp .    Expected Discharge Date:                  Expected Discharge Plan:  Home/Self Care  In-House Referral:     Discharge planning Services  CM Consult, HF Clinic, MATCH Program, Indigent Health Clinic  Post Acute Care Choice:    Choice offered to:     DME Arranged:    DME Agency:     HH Arranged:    HH Agency:     Status of Service:     Medicare Important Message Given:    Date Medicare IM Given:    Medicare IM give by:    Date Additional Medicare IM Given:    Additional Medicare Important Message give by:     If discussed at Long Length of Stay Meetings, dates discussed:    Additional Comments:  Hanley Hays, RN 06/19/2015, 9:37 AM

## 2015-06-19 NOTE — Progress Notes (Signed)
Patient ID: Ethan York, male   DOB: 1975/12/07, 39 y.o.   MRN: [id number]   SUBJECTIVE:  Had cath yesterday and diuresed with IV lasix. Cleda Daub added . Diuresed 6.9 liters. Weight down 21 pounds.   Denies SOB/Orthopnea.   ECHO 06/18/15 EF 20-25%. RV poorly visualized.    RHC RA mean 9 RV 48/12 PA 50/29, mean 37 PCWP mean 22 LV 111/30 AO 115/80 Oxygen saturations: PA 62% AO 95% Cardiac Output (Fick) 7.04  Cardiac Index (Fick) 2.47 PVR 2.13 LHC norm cors.    Scheduled Meds: . aspirin EC  81 mg Oral Daily  . atorvastatin  40 mg Oral q1800  . furosemide  80 mg Intravenous BID  . heparin  5,000 Units Subcutaneous 3 times per day  . Influenza vac split quadrivalent PF  0.5 mL Intramuscular Tomorrow-1000  . losartan  25 mg Oral BID  . nicotine  21 mg Transdermal Daily  . pneumococcal 23 valent vaccine  0.5 mL Intramuscular Tomorrow-1000  . potassium chloride  40 mEq Oral BID  . sodium chloride  3 mL Intravenous Q12H  . sodium chloride  3 mL Intravenous Q12H  . sodium chloride  3 mL Intravenous Q12H  . spironolactone  12.5 mg Oral Daily   Continuous Infusions: . sodium chloride    . nitroGLYCERIN     PRN Meds:.sodium chloride, sodium chloride, sodium chloride, acetaminophen, ALPRAZolam, nitroGLYCERIN, ondansetron (ZOFRAN) IV, sodium chloride, sodium chloride, sodium chloride    Filed Vitals:   06/18/15 1900 06/18/15 2315 06/19/15 0323 06/19/15 0742  BP: 127/89 124/91 124/81 127/89  Pulse: 92  79 87  Temp: 98.1 F (36.7 C) 98 F (36.7 C) 97.7 F (36.5 C) 99.2 F (37.3 C)  TempSrc: Oral Oral Oral Oral  Resp: 16 20 18 18   Height:      Weight:   317 lb 0.3 oz (143.8 kg)   SpO2: 96% 98% 93% 97%    Intake/Output Summary (Last 24 hours) at 06/19/15 0832 Last data filed at 06/19/15 0800  Gross per 24 hour  Intake    930 ml  Output   7250 ml  Net  -6320 ml    LABS: Basic Metabolic Panel:  Recent Labs  03/49/17 0654 06/18/15 1250  NA 139 140  K 3.5 3.7    CL 104 102  CO2 28 29  GLUCOSE 115* 99  BUN 7 7  CREATININE 1.04 1.10  CALCIUM 9.0 8.8*   Liver Function Tests: No results for input(s): AST, ALT, ALKPHOS, BILITOT, PROT, ALBUMIN in the last 72 hours. No results for input(s): LIPASE, AMYLASE in the last 72 hours. CBC:  Recent Labs  06/18/15 0654 06/19/15 0347  WBC 7.3 7.3  HGB 12.0* 12.7*  HCT 37.9* 40.1  MCV 90.7 91.1  PLT 237 255   Cardiac Enzymes:  Recent Labs  06/18/15 0040 06/18/15 0654 06/18/15 1250  TROPONINI 0.04* 0.04* 0.03   BNP: Invalid input(s): POCBNP D-Dimer: No results for input(s): DDIMER in the last 72 hours. Hemoglobin A1C:  Recent Labs  06/18/15 0040  HGBA1C 5.6   Fasting Lipid Panel:  Recent Labs  06/18/15 0654  CHOL 114  HDL 20*  LDLCALC 84  TRIG 48  CHOLHDL 5.7   Thyroid Function Tests:  Recent Labs  06/18/15 0040  TSH 1.399   Anemia Panel: No results for input(s): VITAMINB12, FOLATE, FERRITIN, TIBC, IRON, RETICCTPCT in the last 72 hours.  RADIOLOGY: No results found.  PHYSICAL EXAM General: NAD. Walking in the room.  Neck: JVP  5-6 cm, no thyromegaly or thyroid nodule.  Lungs: Clear to auscultation bilaterally with normal respiratory effort. CV: Nondisplaced PMI.  Heart regular S1/S2, no S3/S4, no murmur.  Edema Trace RLE and 1+ LLE edema.  No carotid bruit.  Normal pedal pulses.  Abdomen: Soft, nontender, no hepatosplenomegaly, no distention.  Neurologic: Alert and oriented x 3.  Psych: Normal affect. Extremities: No clubbing or cyanosis.   TELEMETRY: Reviewed telemetry pt in NSR with LBBB  ASSESSMENT AND PLAN: 39 yo smoker with history of HTN and OSA presents with CHF.  1. Acute systolic CHF: He presented volume overloaded on exam with NYHA class III symptoms. [facility]proBNP at [facility] was markedly elevated. No significant CAD on coronary angiography yesterday, nonischemic cardiomyopathy.  He has had long-standing HTN, which could cause a cardiomyopathy. Cannot  rule out viral myocarditis. Also consider LBBB CMP.  RHC mildly elevated filling pressures.  ECHO EF 20-25%. RV poorly visualized.  He has diuresed excellently, weight down 21 lbs.  Looks euvolemic today. - Stop IV lasix. Start lasix 40 mg twice a day.  - Add carvedilol 3.125 mg twice a day.  -Continue losartan 25 mg bid. Consider entresto as outpatient.  - Continue spironolactone 12.5 daily.  - Check BMET now.  2. CAD: Multiple RFs for CAD as above. LHC normal cors. - Stop ASA and atorvastatin for now.  3. OSA: Will provide him with qhs CPAP.  4. HTN: BP controlled.  5. Smoking: Prior to admit smoking 2 1/2 packs per day. Needs nicotine patch. 6. LBBB  Consult care management for medications prior to admit. Difficulty paying for meds. No insurance.  D/C later today.   Amy Clegg NP-C  06/19/2015 8:32 AM   Patient seen with NP, agree with the above note.  He has diuresed well, doing much better. I think we can convert to po Lasix today.  Home on the above meds.  Needs to quit smoking.  Will need close followup, will see in about 10 days.  Would take next week off work. Will have social work help with insurance, med costs, etc.  [name] 06/19/2015 9:24 AM

## 2015-07-03 ENCOUNTER — Ambulatory Visit (HOSPITAL_COMMUNITY)
Admit: 2015-07-03 | Discharge: 2015-07-03 | Disposition: A | Discharge status: Home / Self Care | Payer: BLUE CROSS/BLUE SHIELD | Source: Ambulatory Visit | Attending: Cardiology | Admitting: Cardiology

## 2015-07-03 ENCOUNTER — Encounter (HOSPITAL_COMMUNITY): Payer: Self-pay | Admitting: *Deleted

## 2015-07-03 VITALS — BP 112/72 | HR 75 | Ht 79.0 in | Wt 309.0 lb

## 2015-07-03 DIAGNOSIS — I5022 Chronic systolic (congestive) heart failure: Secondary | ICD-10-CM | POA: Diagnosis not present

## 2015-07-03 DIAGNOSIS — G4733 Obstructive sleep apnea (adult) (pediatric): Secondary | ICD-10-CM | POA: Insufficient documentation

## 2015-07-03 DIAGNOSIS — I509 Heart failure, unspecified: Secondary | ICD-10-CM

## 2015-07-03 DIAGNOSIS — Z720 Tobacco use: Secondary | ICD-10-CM | POA: Insufficient documentation

## 2015-07-03 LAB — BASIC METABOLIC PANEL
Anion gap: 7 (ref 5–15)
BUN: 23 mg/dL — ABNORMAL HIGH (ref 6–20)
CHLORIDE: 101 mmol/L (ref 101–111)
CO2: 26 mmol/L (ref 22–32)
Calcium: 9.8 mg/dL (ref 8.9–10.3)
Creatinine, Ser: 1.06 mg/dL (ref 0.61–1.24)
GFR calc non Af Amer: >60 mL/min (ref >60)
Glucose, Bld: 110 mg/dL — ABNORMAL HIGH (ref 65–99)
POTASSIUM: 4.3 mmol/L (ref 3.5–5.1)
SODIUM: 134 mmol/L — AB (ref 135–145)

## 2015-07-03 LAB — BRAIN NATRIURETIC PEPTIDE: B NATRIURETIC PEPTIDE 5: 122.5 pg/mL — AB (ref 0.0–100.0)

## 2015-07-03 MED ORDER — SACUBITRIL-VALSARTAN 24-26 MG PO TABS: 1.0000 | ORAL_TABLET | Freq: Two times a day (BID) | ORAL | Status: DC

## 2015-07-03 MED ORDER — SPIRONOLACTONE 25 MG PO TABS: 25.0000 mg | ORAL_TABLET | Freq: Every day | ORAL | Status: DC

## 2015-07-03 MED ORDER — AMOXICILLIN-POT CLAVULANATE 875-125 MG PO TABS: 1.0000 | ORAL_TABLET | Freq: Two times a day (BID) | ORAL | Status: DC

## 2015-07-03 MED ORDER — FUROSEMIDE 40 MG PO TABS: ORAL_TABLET | ORAL | Status: DC

## 2015-07-03 NOTE — Patient Instructions (Addendum)
Stop Losartan  Start Entresto 24/26 mg twice a day  Start Augmentin 875 mg twice a day for 10 days  Increase Spironolactone 25 mg daily  Decrease Lasix 40 mg in the morning and 20 mg in the afternoon  Labs today will call if abnormal   Follow up in 2 weeks with Dr Shirlee Latch

## 2015-07-03 NOTE — Progress Notes (Addendum)
Patient ID: Ethan York, male   DOB: 04/16/1976, 39 y.o.   MRN: [id number] PCP: Dr. Tommi [facility]) Cardiology: Dr. Shirlee York  39 yo with history of HTN and OSA was admitted to [facility] in 11/16 with several weeks of progressive dyspnea and was found to have acute systolic CHF with EF 20-25% by echo.  No chest pain.  He was diuresed and had left and right heart cath, showing preserved cardiac output with elevated filling pressures and no significant CAD.    Since getting back home, he has been doing quite well.  He has no exertional dyspnea but has not been very active.  He did go hunting last weekend and walked up and down hills without problems. No orthopnea/PND.  No palpitations.  No viral-type infection that he can remember.  He is not a heavy drinker and denies drugs.  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   ECG: NSR, LBBB with QRS 186 msec  Labs (11/16): K 3.3, creatinine 1.19, LDL 84, BNP 884, TSH normal PMH: 1. HTN 2. OSA: Using CPAP  3. GERD 4. Chronic systolic CHF: Nonischemic cardiomyopathy.  Echo (11/16) with EF 20-25%, diffuse hypokinesis, mild-moderate MR, PASP 35 mmHg, severe LAE.  LHC/RHC (11/16) with no significant CAD; mean RA 9, PA 50/29 mean 37, mean PCWP 22, CI 2.47, PVR 2.13, LVEDP 30.  5. LBBB 6. Active smoker  FH: - Brother with MI at 20 - Mother with MI in her 37s - No FH of cardiomyopathy.   SH: Married, lives in [place], Games developer.  He smoked 2.5 ppd, now down to 2-3 cigs/day.  Occasional ETOH.   ROS: All systems reviewed and negative except as per HPI.   Current Outpatient Prescriptions  Medication Sig Dispense Refill  . carvedilol (COREG) 3.125 MG tablet Take 1 tablet (3.125 mg total) by mouth 2 (two) times daily with a meal. 60 tablet 0  . furosemide (LASIX) 40 MG tablet Take 40 mg (1 tablet) in the morning and 20 mg (1/2 tablet) in the afternoon 60 tablet 0  . nicotine (NICODERM CQ - DOSED IN MG/24 HOURS) 21  mg/24hr patch Place 1 patch (21 mg total) onto the skin daily. 28 patch 0  . potassium chloride SA (K-DUR,KLOR-CON) 20 MEQ tablet Take 1 tablet (20 mEq total) by mouth daily. 30 tablet 0  . spironolactone (ALDACTONE) 25 MG tablet Take 1 tablet (25 mg total) by mouth daily. 30 tablet 0  . amoxicillin-clavulanate (AUGMENTIN) 875-125 MG tablet Take 1 tablet by mouth 2 (two) times daily. 20 tablet 0  . sacubitril-valsartan (ENTRESTO) 24-26 MG Take 1 tablet by mouth 2 (two) times daily. 60 tablet 3   No current facility-administered medications for this encounter.   BP 112/72 mmHg  Pulse 75  Ht 6\' 7"  (2.007 m)  Wt 309 lb (140.161 kg)  BMI 34.80 kg/m2  SpO2 98% General: NAD Neck: No JVD, no thyromegaly or thyroid nodule.  Lungs: Clear to auscultation bilaterally with normal respiratory effort. CV: Nondisplaced PMI.  Heart regular S1/S2, no S3/S4, no murmur.  No peripheral edema.  No carotid bruit.  Normal pedal pulses.  Abdomen: Soft, nontender, no hepatosplenomegaly, no distention.  Skin: Intact without lesions or rashes.  Neurologic: Alert and oriented x 3.  Psych: Normal affect. Extremities: No clubbing or cyanosis.  HEENT: Normal.   Assessment/Plan: 1. Chronic systolic CHF: Nonischemic cardiomyopathy.  EF 20-25% on 11/16 echo.  Cause of cardiomyopathy still uncertain.  Has  family history of CAD but not cardiomyopathy.  Possible viral myocarditis, ?related to poor control of HTN.  Need to send SPEP/UPEP and iron studies as well.  On exam today, he is euvolemic.  NYHA class II symptoms.   - As above, send SPEP/UPEP and iron studies.   - Increase spironolactone to 25 mg daily. - Stop losartan and start Entresto 24/26 bid with BMET/BNP in 10 days.  - Decrease Lasix to 40 qam/20 qpm.  - He has a wide LBBB.  He will need an echo in 6 months, and if EF remains low, will arrange for CRT-D.  - When he has insurance, I will arrange for cardiac MRI to look for infiltrative disease.  2. Smoking:  He is cutting back, needs to quit.  3. OSA: Use CPAP.   Followup in 2 wks.   Ethan York 07/04/2015

## 2015-07-04 DIAGNOSIS — I5022 Chronic systolic (congestive) heart failure: Secondary | ICD-10-CM | POA: Insufficient documentation

## 2015-07-04 HISTORY — DX: Chronic systolic (congestive) heart failure: I50.22

## 2015-07-04 NOTE — Progress Notes (Signed)
Patient ID: Ethan York, male   DOB: Jul 16, 1976, 39 y.o.   MRN: [id number]

## 2015-07-04 NOTE — Addendum Note (Signed)
Encounter addended by: Laurey Morale, MD on: 07/04/2015 12:17 AM<BR>     Documentation filed: Notes Section, Clinical Notes

## 2015-07-06 ENCOUNTER — Telehealth (HOSPITAL_COMMUNITY): Payer: Self-pay

## 2015-07-06 NOTE — Telephone Encounter (Signed)
Luanna Cole with [employer] called to inform us that patient has been enrolled in patient assistance program. [phone]--opt 2--opt 5

## 2015-07-08 ENCOUNTER — Other Ambulatory Visit (HOSPITAL_COMMUNITY): Payer: Self-pay | Admitting: Pharmacist

## 2015-07-08 MED ORDER — SACUBITRIL-VALSARTAN 24-26 MG PO TABS: 1.0000 | ORAL_TABLET | Freq: Two times a day (BID) | ORAL | Status: DC

## 2015-07-15 ENCOUNTER — Telehealth (HOSPITAL_COMMUNITY): Payer: Self-pay | Admitting: *Deleted

## 2015-07-15 MED ORDER — POTASSIUM CHLORIDE CRYS ER 20 MEQ PO TBCR: 20.0000 meq | EXTENDED_RELEASE_TABLET | Freq: Every day | ORAL | Status: DC

## 2015-07-15 NOTE — Telephone Encounter (Signed)
Pt needs refill of his KCL, rx sent in

## 2015-07-17 ENCOUNTER — Other Ambulatory Visit (HOSPITAL_COMMUNITY): Payer: Self-pay | Admitting: Cardiology

## 2015-07-17 DIAGNOSIS — I5022 Chronic systolic (congestive) heart failure: Secondary | ICD-10-CM

## 2015-07-17 MED ORDER — POTASSIUM CHLORIDE CRYS ER 20 MEQ PO TBCR: 20.0000 meq | EXTENDED_RELEASE_TABLET | Freq: Every day | ORAL | Status: DC

## 2015-07-20 ENCOUNTER — Ambulatory Visit (HOSPITAL_COMMUNITY)
Admission: RE | Admit: 2015-07-20 | Discharge: 2015-07-20 | Disposition: A | Discharge status: Home / Self Care | Payer: BLUE CROSS/BLUE SHIELD | Source: Ambulatory Visit | Attending: Cardiology | Admitting: Cardiology

## 2015-07-20 VITALS — BP 104/70 | HR 75 | Ht 79.0 in | Wt 307.0 lb

## 2015-07-20 DIAGNOSIS — F17200 Nicotine dependence, unspecified, uncomplicated: Secondary | ICD-10-CM | POA: Insufficient documentation

## 2015-07-20 DIAGNOSIS — I5022 Chronic systolic (congestive) heart failure: Secondary | ICD-10-CM | POA: Diagnosis not present

## 2015-07-20 DIAGNOSIS — Z720 Tobacco use: Secondary | ICD-10-CM | POA: Diagnosis not present

## 2015-07-20 DIAGNOSIS — G4733 Obstructive sleep apnea (adult) (pediatric): Secondary | ICD-10-CM | POA: Insufficient documentation

## 2015-07-20 LAB — BASIC METABOLIC PANEL
ANION GAP: 6 (ref 5–15)
BUN: 17 mg/dL (ref 6–20)
CALCIUM: 9.6 mg/dL (ref 8.9–10.3)
CO2: 26 mmol/L (ref 22–32)
CREATININE: 1.04 mg/dL (ref 0.61–1.24)
Chloride: 104 mmol/L (ref 101–111)
Glucose, Bld: 111 mg/dL — ABNORMAL HIGH (ref 65–99)
Potassium: 4.8 mmol/L (ref 3.5–5.1)
SODIUM: 136 mmol/L (ref 135–145)

## 2015-07-20 LAB — BRAIN NATRIURETIC PEPTIDE: B Natriuretic Peptide: 184.8 pg/mL — ABNORMAL HIGH (ref 0.0–100.0)

## 2015-07-20 MED ORDER — BUPROPION HCL ER (SR) 150 MG PO TB12: ORAL_TABLET | ORAL | Status: DC

## 2015-07-20 MED ORDER — FUROSEMIDE 40 MG PO TABS: 40.0000 mg | ORAL_TABLET | Freq: Every day | ORAL | Status: DC

## 2015-07-20 MED ORDER — CARVEDILOL 6.25 MG PO TABS: 6.2500 mg | ORAL_TABLET | Freq: Two times a day (BID) | ORAL | Status: DC

## 2015-07-20 NOTE — Patient Instructions (Signed)
Increase Carvedilol to 6.25 mg Twice daily   Decrease Furosemide (Lasix) to 40 mg daily  Start Wellbutrin 150 mg daily FOR 3 DAYS, then 150 mg Twice daily   Labs today  Your physician has requested that you have a cardiac MRI. Cardiac MRI uses a computer to create images of your heart as its beating, producing both still and moving pictures of your heart and major blood vessels. For further information please visit [email or web address]. Please follow the instruction sheet given to you today for more information.  Your physician recommends that you schedule a follow-up appointment in: 1 month

## 2015-07-20 NOTE — Progress Notes (Signed)
Advanced Heart Failure Medication Review by a Pharmacist  Does the patient  feel that his/her medications are working for him/her?  yes  Has the patient been experiencing any side effects to the medications prescribed?  no  Does the patient measure his/her own blood pressure or blood glucose at home?  no   Does the patient have any problems obtaining medications due to transportation or finances?   no  Understanding of regimen: good Understanding of indications: good Potential of compliance: good Patient understands to avoid NSAIDs. Patient understands to avoid decongestants.  Issues to address at subsequent visits: Entresto coverage   Pharmacist comments:  Mr. Molle is a pleasant 39 yo M presenting without a medication list. He reports excellent compliance with his medications. He does state that he was recently approved for [employer] but does not have the card yet (ID [id number]). He did not have any other medication-related questions or concerns for me at this time.   Tyler Deis. Bonnye Fava, PharmD, BCPS, CPP Clinical Pharmacist Pager: [phone] Phone: [phone] 07/20/2015 9:16 AM      Time with patient: 8 minutes Preparation and documentation time: 2 minutes Total time: 10 minutes

## 2015-07-20 NOTE — Progress Notes (Signed)
Patient ID: Ethan York, male   DOB: 1975-10-28, 39 y.o.   MRN: [id number] PCP: Dr. Tommi Rumps [facility]) Cardiology: Dr. Shirlee Latch  39 yo with history of HTN and OSA was admitted to [facility] in 11/16 with several weeks of progressive dyspnea and was found to have acute systolic CHF with EF 20-25% by echo.  No chest pain.  He was diuresed and had left and right heart cath, showing preserved cardiac output with elevated filling pressures and no significant CAD.    Since getting back home, he has been doing quite well.  He has had no exertional dyspnea.  He is back at work full time.  Mild lightheadedness if he bends over and then stands up quickly.  No orthopnea/PND.  No palpitations.  Some fatigue. He has been out hunting with no problems.  He has been cutting back on smoking and blames this for increased anxiety and irritability.   ECG: NSR, LBBB with QRS 186 msec  Labs (11/16): K 3.3, creatinine 1.19, LDL 84, BNP 884, TSH normal Labs (12/16): K 4.3, creatinine 1.06, BNP 123  PMH: 1. HTN 2. OSA: Using CPAP  3. GERD 4. Chronic systolic CHF: Nonischemic cardiomyopathy.  Echo (11/16) with EF 20-25%, diffuse hypokinesis, mild-moderate MR, PASP 35 mmHg, severe LAE.  LHC/RHC (11/16) with no significant CAD; mean RA 9, PA 50/29 mean 37, mean PCWP 22, CI 2.47, PVR 2.13, LVEDP 30.  5. LBBB 6. Active smoker  FH: - Brother with MI at 89 - Mother with MI in her 52s - No FH of cardiomyopathy.   SH: Married, lives in [place], Games developer.  He smoked 2.5 ppd, now down to 2-3 cigs/day.  Occasional ETOH.   ROS: All systems reviewed and negative except as per HPI.   Current Outpatient Prescriptions  Medication Sig Dispense Refill  . Aspirin-Acetaminophen (GOODY BODY PAIN) 500-325 MG PACK Take 1 packet by mouth daily.    . carvedilol (COREG) 6.25 MG tablet Take 1 tablet (6.25 mg total) by mouth 2 (two) times daily with a meal. 60 tablet 3  . furosemide (LASIX) 40 MG tablet Take 1 tablet (40 mg total)  by mouth daily. 60 tablet 0  . nicotine (NICODERM CQ - DOSED IN MG/24 HOURS) 21 mg/24hr patch Place 1 patch (21 mg total) onto the skin daily. 28 patch 0  . potassium chloride SA (K-DUR,KLOR-CON) 20 MEQ tablet Take 1 tablet (20 mEq total) by mouth daily. 30 tablet 6  . sacubitril-valsartan (ENTRESTO) 24-26 MG Take 1 tablet by mouth 2 (two) times daily. 60 tablet 11  . spironolactone (ALDACTONE) 25 MG tablet Take 1 tablet (25 mg total) by mouth daily. 30 tablet 0  . buPROPion (WELLBUTRIN SR) 150 MG 12 hr tablet Take 1 tab daily for 3 days then take 1 tab Twice daily 60 tablet 3   No current facility-administered medications for this encounter.   BP 104/70 mmHg  Pulse 75  Ht 6\' 7"  (2.007 m)  Wt 307 lb (139.254 kg)  BMI 34.57 kg/m2  SpO2 98% General: NAD Neck: No JVD, no thyromegaly or thyroid nodule.  Lungs: Clear to auscultation bilaterally with normal respiratory effort. CV: Nondisplaced PMI.  Heart regular S1/S2, no S3/S4, no murmur.  Trace ankle edema.  No carotid bruit.  Normal pedal pulses.  Abdomen: Soft, nontender, no hepatosplenomegaly, no distention.  Skin: Intact without lesions or rashes.  Neurologic: Alert and oriented x 3.  Psych: Normal affect. Extremities: No clubbing or cyanosis. Bilateral lower leg venous varicosities.  HEENT: Normal.   Assessment/Plan: 1. Chronic systolic CHF: Nonischemic cardiomyopathy.  EF 20-25% on 11/16 echo.  Cause of cardiomyopathy still uncertain.  Has family history of CAD but not cardiomyopathy.  Possible viral myocarditis, ?related to poor control of HTN.  On exam today, he is euvolemic.  NYHA class II symptoms.   - Continue current Entresto and spironolactone.  - Decrease Lasix to 40 mg daily. - Increase Coreg to 6.25 mg bid.  - BMET/BNP today.  - He has a wide LBBB.  He will need an echo in 5/07, and if EF remains low, will arrange for CRT-D.  - I will arrange for cardiac MRI to look for infiltrative disease.  2. Smoking: He is cutting  back, needs to quit.  I am going to start him on Wellbutrin to help him quit and also to help his mood.  3. OSA: Use CPAP.   Followup in 1 month.   [name] 07/20/2015

## 2015-07-21 ENCOUNTER — Telehealth (HOSPITAL_COMMUNITY): Payer: Self-pay | Admitting: Pharmacist

## 2015-07-21 NOTE — Telephone Encounter (Signed)
Entresto PA approved from 07/20/2015 through 07/31/2038 by [employer].   Tyler Deis. Bonnye Fava, PharmD, BCPS, CPP Clinical Pharmacist Pager: [phone] Phone: [phone] 07/21/2015 3:39 PM

## 2015-08-05 ENCOUNTER — Telehealth (HOSPITAL_COMMUNITY): Payer: Self-pay | Admitting: *Deleted

## 2015-08-05 MED ORDER — SPIRONOLACTONE 25 MG PO TABS: 25.0000 mg | ORAL_TABLET | Freq: Every day | ORAL | Status: DC

## 2015-08-05 NOTE — Telephone Encounter (Signed)
Pt states he needs refill of Cleda Daub he is out, rx sent in

## 2015-08-07 ENCOUNTER — Telehealth (HOSPITAL_COMMUNITY): Payer: Self-pay

## 2015-08-07 NOTE — Telephone Encounter (Signed)
Patient called CHF triage line to enquire about Entresto costing $90 per month. Will forward to CHF pharmacist to see if there is any way to help patient afford this medication.  Ave Filter

## 2015-08-10 ENCOUNTER — Telehealth (HOSPITAL_COMMUNITY): Payer: Self-pay | Admitting: Pharmacist

## 2015-08-10 NOTE — Telephone Encounter (Signed)
Spoke with patient and will mail patient copay assistance card which should cover most of copay so that he will only be responsible for $10/month.   Tyler Deis. Bonnye Fava, PharmD, BCPS, CPP Clinical Pharmacist Pager: [phone] Phone: [phone] 08/10/2015 10:39 AM

## 2015-08-10 NOTE — Telephone Encounter (Signed)
-----   Message from Chyrl Civatte, RN sent at 08/07/2015  1:13 PM EST ----- Patient states Entresto costs $90 per month. Can't afford, can you see if theres anythign we can do for him?

## 2015-08-14 ENCOUNTER — Telehealth (HOSPITAL_COMMUNITY): Payer: Self-pay | Admitting: Cardiology

## 2015-08-14 NOTE — Telephone Encounter (Signed)
Patient called to check status of cardiac mri scheduling Reviewed PA status and seems as if Leavy Cella, CMA was waiting on updated insurance information   Insurance information updated, may proceed with scheduling

## 2015-08-21 ENCOUNTER — Ambulatory Visit (HOSPITAL_COMMUNITY)
Admission: RE | Admit: 2015-08-21 | Discharge: 2015-08-21 | Disposition: A | Discharge status: Home / Self Care | Payer: BLUE CROSS/BLUE SHIELD | Source: Ambulatory Visit | Attending: Cardiology | Admitting: Cardiology

## 2015-08-21 ENCOUNTER — Encounter (HOSPITAL_COMMUNITY): Payer: Self-pay

## 2015-08-21 ENCOUNTER — Telehealth (HOSPITAL_COMMUNITY): Payer: Self-pay | Admitting: *Deleted

## 2015-08-21 VITALS — BP 96/52 | HR 84 | Wt 301.0 lb

## 2015-08-21 DIAGNOSIS — I5022 Chronic systolic (congestive) heart failure: Secondary | ICD-10-CM

## 2015-08-21 DIAGNOSIS — Z8249 Family history of ischemic heart disease and other diseases of the circulatory system: Secondary | ICD-10-CM | POA: Diagnosis not present

## 2015-08-21 DIAGNOSIS — K219 Gastro-esophageal reflux disease without esophagitis: Secondary | ICD-10-CM | POA: Insufficient documentation

## 2015-08-21 DIAGNOSIS — F17200 Nicotine dependence, unspecified, uncomplicated: Secondary | ICD-10-CM

## 2015-08-21 DIAGNOSIS — Z720 Tobacco use: Secondary | ICD-10-CM | POA: Diagnosis not present

## 2015-08-21 DIAGNOSIS — I110 Hypertensive heart disease with heart failure: Secondary | ICD-10-CM | POA: Diagnosis not present

## 2015-08-21 DIAGNOSIS — F17210 Nicotine dependence, cigarettes, uncomplicated: Secondary | ICD-10-CM | POA: Insufficient documentation

## 2015-08-21 DIAGNOSIS — G4733 Obstructive sleep apnea (adult) (pediatric): Secondary | ICD-10-CM | POA: Diagnosis not present

## 2015-08-21 DIAGNOSIS — Z79899 Other long term (current) drug therapy: Secondary | ICD-10-CM | POA: Insufficient documentation

## 2015-08-21 DIAGNOSIS — Z9989 Dependence on other enabling machines and devices: Secondary | ICD-10-CM

## 2015-08-21 DIAGNOSIS — I428 Other cardiomyopathies: Secondary | ICD-10-CM | POA: Insufficient documentation

## 2015-08-21 MED ORDER — FUROSEMIDE 40 MG PO TABS: 40.0000 mg | ORAL_TABLET | Freq: Every day | ORAL | Status: DC

## 2015-08-21 NOTE — Patient Instructions (Signed)
Stop Potassium  Hold your Furosemide (Lasix) is weight is less than 295 lb  Your physician recommends that you schedule a follow-up appointment in: 4-6 weeks

## 2015-08-21 NOTE — Telephone Encounter (Signed)
Pt approved for cardiac mri  #[id number]  Valid through 08/17/2015 - 09/15/2015

## 2015-08-21 NOTE — Progress Notes (Signed)
Patient ID: Ethan York, male   DOB: [birth year], 40 y.o.   MRN: [id number]  PCP: Dr. Tommi Rumps [facility]) Cardiology: Dr. Shirlee Latch  40 yo with history of HTN and OSA was admitted to [facility] in 11/16 with several weeks of progressive dyspnea and was found to have acute systolic CHF with EF 20-25% by echo.  No chest pain.  He was diuresed and had left and right heart cath, showing preserved cardiac output with elevated filling pressures and no significant CAD.    He returns for HF follow up. Last visit lasix was cut back to 40 mg daily and coreg was increased.  Overall feeling ok. Able to walk up hills. Denies SOB/ Orthopnea. Weight at home 296-297 pounds. Appetite ok. Smoking 2-3 cigarettes daily. CPAP nightly. Working full time as Games developer. Very active on a daily basis. Takes medications daily.   Labs (11/16): K 3.3, creatinine 1.19, LDL 84, BNP 884, TSH normal Labs (12/16): K 4.3, creatinine 1.06, BNP 123 Labs 07/20/2015: K 4.8 Creatinine 1.04   PMH: 1. HTN 2. OSA: Using CPAP  3. GERD 4. Chronic systolic CHF: Nonischemic cardiomyopathy.  Echo (11/16) with EF 20-25%, diffuse hypokinesis, mild-moderate MR, PASP 35 mmHg, severe LAE.  LHC/RHC (11/16) with no significant CAD; mean RA 9, PA 50/29 mean 37, mean PCWP 22, CI 2.47, PVR 2.13, LVEDP 30.  5. LBBB 6. Active smoker  FH: - Brother with MI at 78 - Mother with MI in her 1s - No FH of cardiomyopathy.   SH: Married, lives in [place], Games developer.  He smoked 2.5 ppd, now down to 2-3 cigs/day.  Occasional ETOH.   ROS: All systems reviewed and negative except as per HPI.   Current Outpatient Prescriptions  Medication Sig Dispense Refill  . Aspirin-Acetaminophen (GOODY BODY PAIN) 500-325 MG PACK Take 1 packet by mouth daily.    Marland Kitchen buPROPion (WELLBUTRIN SR) 150 MG 12 hr tablet Take 1 tab daily for 3 days then take 1 tab Twice daily 60 tablet 3  . carvedilol (COREG) 6.25 MG tablet Take 1 tablet (6.25 mg total) by mouth 2 (two) times  daily with a meal. 60 tablet 3  . furosemide (LASIX) 40 MG tablet Take 1 tablet (40 mg total) by mouth daily. 60 tablet 0  . nicotine (NICODERM CQ - DOSED IN MG/24 HOURS) 21 mg/24hr patch Place 1 patch (21 mg total) onto the skin daily. 28 patch 0  . potassium chloride SA (K-DUR,KLOR-CON) 20 MEQ tablet Take 1 tablet (20 mEq total) by mouth daily. 30 tablet 6  . sacubitril-valsartan (ENTRESTO) 24-26 MG Take 1 tablet by mouth 2 (two) times daily. 60 tablet 11  . spironolactone (ALDACTONE) 25 MG tablet Take 1 tablet (25 mg total) by mouth daily. 30 tablet 3   No current facility-administered medications for this encounter.   BP 96/52 mmHg  Pulse 84  Wt 301 lb (136.533 kg)  SpO2 97% General: NAD Neck: No JVD, no thyromegaly or thyroid nodule.  Lungs: Clear to auscultation bilaterally with normal respiratory effort. CV: Nondisplaced PMI.  Heart regular S1/S2, no S3/S4, no murmur.  No carotid bruit.  Normal pedal pulses.  Abdomen: Soft, nontender, no hepatosplenomegaly, no distention.  Skin: Intact without lesions or rashes.  Neurologic: Alert and oriented x 3.  Psych: Normal affect. Extremities: No clubbing or cyanosis. Bilateral lower leg venous varicosities. No edema HEENT: Normal.   Assessment/Plan: 1. Chronic systolic CHF: Nonischemic cardiomyopathy.  EF 20-25% on 11/16 echo.  Cause of cardiomyopathy still  uncertain.  Has family history of CAD but not cardiomyopathy.  Possible viral myocarditis, ?related to poor control of HTN.     NYHA class II symptoms.  BP to low to titrate HF meds.  - Continue Entresto 24-26 mg twice a day and spironolactone 25 mg daily.  - Continue Lasix to 40 mg daily. Hold lasix if weight is less than 297 pounds. Stop potassium.  - Continue  Coreg to 6.25 mg bid.  - He has a wide LBBB.  He will need an echo in 5/07, and if EF remains low, will arrange for CRT-D.  - Set up CMRI to look for infiltrative disease.  2. Smoking: He is cutting back, needs to quit.   Encouraged to quit.  3. OSA: Continue nightly CPAP. e CPAP.   Follow up in 4-6 weeks. Set up cMRI.    Catha Ontko NP-C  08/21/2015

## 2015-08-21 NOTE — Telephone Encounter (Signed)
Pt approved for cardiac mri  #[id number]  Valid through 08/17/2015 - 09/15/2015   Message sent to shawnee trigloff on 08/17/15 to schedule

## 2015-08-27 ENCOUNTER — Encounter: Payer: Self-pay | Admitting: Cardiology

## 2015-09-10 ENCOUNTER — Other Ambulatory Visit (HOSPITAL_COMMUNITY): Payer: Self-pay | Admitting: *Deleted

## 2015-09-10 ENCOUNTER — Ambulatory Visit (HOSPITAL_COMMUNITY)
Admission: RE | Admit: 2015-09-10 | Discharge: 2015-09-10 | Disposition: A | Discharge status: Home / Self Care | Payer: BLUE CROSS/BLUE SHIELD | Source: Ambulatory Visit | Attending: Cardiology | Admitting: Cardiology

## 2015-09-10 ENCOUNTER — Telehealth (HOSPITAL_COMMUNITY): Payer: Self-pay | Admitting: Vascular Surgery

## 2015-09-10 DIAGNOSIS — I5022 Chronic systolic (congestive) heart failure: Secondary | ICD-10-CM | POA: Insufficient documentation

## 2015-09-10 LAB — CREATININE, SERUM
CREATININE: 1.26 mg/dL — AB (ref 0.61–1.24)
GFR calc Af Amer: >60 mL/min (ref >60)
GFR calc non Af Amer: >60 mL/min (ref >60)

## 2015-09-10 MED ORDER — FUROSEMIDE 40 MG PO TABS: 40.0000 mg | ORAL_TABLET | Freq: Every day | ORAL | Status: DC

## 2015-09-10 NOTE — Telephone Encounter (Signed)
Pt need refill on Furosemide 40 mg.. Sent to [facility] in [place]

## 2015-09-14 ENCOUNTER — Telehealth (HOSPITAL_COMMUNITY): Payer: Self-pay | Admitting: *Deleted

## 2015-09-14 NOTE — Telephone Encounter (Signed)
-----   Message from Ethan York sent at 09/10/2015  1:04 PM EST ----- Regarding: MRI   Dorenda Pfannenstiel  This patient did not fit in the scanner so the MRI was not performed.  [employer]

## 2015-09-14 NOTE — Telephone Encounter (Signed)
Will notify Dr Shirlee Latch, pt is sch for f/u with [place] 2/28

## 2015-09-29 ENCOUNTER — Ambulatory Visit (HOSPITAL_COMMUNITY)
Admission: RE | Admit: 2015-09-29 | Discharge: 2015-09-29 | Disposition: A | Discharge status: Home / Self Care | Payer: BLUE CROSS/BLUE SHIELD | Source: Ambulatory Visit | Attending: Student | Admitting: Student

## 2015-09-29 ENCOUNTER — Encounter (HOSPITAL_COMMUNITY): Payer: Self-pay | Admitting: Cardiology

## 2015-09-29 VITALS — BP 108/60 | HR 80 | Wt 302.2 lb

## 2015-09-29 DIAGNOSIS — F17210 Nicotine dependence, cigarettes, uncomplicated: Secondary | ICD-10-CM | POA: Insufficient documentation

## 2015-09-29 DIAGNOSIS — I110 Hypertensive heart disease with heart failure: Secondary | ICD-10-CM | POA: Diagnosis not present

## 2015-09-29 DIAGNOSIS — Z720 Tobacco use: Secondary | ICD-10-CM

## 2015-09-29 DIAGNOSIS — Z79899 Other long term (current) drug therapy: Secondary | ICD-10-CM | POA: Insufficient documentation

## 2015-09-29 DIAGNOSIS — I428 Other cardiomyopathies: Secondary | ICD-10-CM | POA: Diagnosis not present

## 2015-09-29 DIAGNOSIS — K219 Gastro-esophageal reflux disease without esophagitis: Secondary | ICD-10-CM | POA: Diagnosis not present

## 2015-09-29 DIAGNOSIS — Z8249 Family history of ischemic heart disease and other diseases of the circulatory system: Secondary | ICD-10-CM | POA: Diagnosis not present

## 2015-09-29 DIAGNOSIS — Z9989 Dependence on other enabling machines and devices: Secondary | ICD-10-CM

## 2015-09-29 DIAGNOSIS — I5022 Chronic systolic (congestive) heart failure: Secondary | ICD-10-CM | POA: Diagnosis not present

## 2015-09-29 DIAGNOSIS — F17200 Nicotine dependence, unspecified, uncomplicated: Secondary | ICD-10-CM

## 2015-09-29 DIAGNOSIS — G4733 Obstructive sleep apnea (adult) (pediatric): Secondary | ICD-10-CM | POA: Diagnosis not present

## 2015-09-29 MED ORDER — VARENICLINE TARTRATE 1 MG PO TABS: 1.0000 mg | ORAL_TABLET | Freq: Two times a day (BID) | ORAL | Status: DC

## 2015-09-29 MED ORDER — CARVEDILOL 6.25 MG PO TABS: 9.3750 mg | ORAL_TABLET | Freq: Two times a day (BID) | ORAL | Status: DC

## 2015-09-29 MED ORDER — VARENICLINE TARTRATE 0.5 MG X 11 & 1 MG X 42 PO MISC: ORAL | Status: DC

## 2015-09-29 NOTE — Progress Notes (Signed)
Patient ID: Ethan York, male   DOB: Dec 06, 1975, 40 y.o.   MRN: [id number]  PCP: Dr. Tommi Rumps [facility]) Cardiology: Dr. Shirlee Latch  40 yo with history of HTN and OSA was admitted to [facility] in 11/16 with several weeks of progressive dyspnea and was found to have acute systolic CHF with EF 20-25% by echo.  No chest pain.  He was diuresed and had left and right heart cath, showing preserved cardiac output with elevated filling pressures and no significant CAD.    He returns for HF follow up. Attempted CMRI but unable to fit in scanner. Overall feeling ok. Able to walk up hills. Denies SOB/ Orthopnea. Weight at home 296-297 pounds. Appetite ok. Smoking 2 cigarettes daily. CPAP nightly. Working full time as Games developer. Takes medications daily.   Labs (11/16): K 3.3, creatinine 1.19, LDL 84, BNP 884, TSH normal Labs (12/16): K 4.3, creatinine 1.06, BNP 123 Labs 07/20/2015: K 4.8 Creatinine 1.04  Labs 09/10/2015: Creatinine 1.26  PMH: 1. HTN 2. OSA: Using CPAP  3. GERD 4. Chronic systolic CHF: Nonischemic cardiomyopathy.  Echo (11/16) with EF 20-25%, diffuse hypokinesis, mild-moderate MR, PASP 35 mmHg, severe LAE.  LHC/RHC (11/16) with no significant CAD; mean RA 9, PA 50/29 mean 37, mean PCWP 22, CI 2.47, PVR 2.13, LVEDP 30.  5. LBBB 6. Active smoker  FH: - Brother with MI at 20 - Mother with MI in her 36s - No FH of cardiomyopathy.   SH: Married, lives in [place], Games developer.  He smoked 2.5 ppd, now down to 2-3 cigs/day.  Occasional ETOH.   ROS: All systems reviewed and negative except as per HPI.   Current Outpatient Prescriptions  Medication Sig Dispense Refill  . Aspirin-Acetaminophen (GOODY BODY PAIN) 500-325 MG PACK Take 1 packet by mouth daily.    Marland Kitchen buPROPion (WELLBUTRIN SR) 150 MG 12 hr tablet Take 1 tab daily for 3 days then take 1 tab Twice daily 60 tablet 3  . carvedilol (COREG) 6.25 MG tablet Take 1 tablet (6.25 mg total) by mouth 2 (two) times daily with a meal. 60  tablet 3  . furosemide (LASIX) 40 MG tablet Take 1 tablet (40 mg total) by mouth daily. HOLD IF WEIGHT IS LESS THAN 295 60 tablet 3  . sacubitril-valsartan (ENTRESTO) 24-26 MG Take 1 tablet by mouth 2 (two) times daily. 60 tablet 11  . spironolactone (ALDACTONE) 25 MG tablet Take 1 tablet (25 mg total) by mouth daily. 30 tablet 3   No current facility-administered medications for this encounter.   BP 108/60 mmHg  Pulse 80  Wt 302 lb 3.2 oz (137.077 kg)  SpO2 98% General: NAD. Walking in the clinic without difficulty.  Neck: No JVD, no thyromegaly or thyroid nodule.  Lungs: Clear to auscultation bilaterally with normal respiratory effort. CV: Nondisplaced PMI.  Heart regular S1/S2, no S3/S4, no murmur.  No carotid bruit.  Normal pedal pulses.  Abdomen: Soft, nontender, no hepatosplenomegaly, no distention.  Skin: Intact without lesions or rashes.  Neurologic: Alert and oriented x 3.  Psych: Normal affect. Extremities: No clubbing or cyanosis. Bilateral lower leg venous varicosities. No edema HEENT: Normal.   Assessment/Plan: 1. Chronic systolic CHF: Nonischemic cardiomyopathy.  EF 20-25% on 11/16 echo.  Cause of cardiomyopathy still uncertain.  Has family history of CAD but not cardiomyopathy.  Possible viral myocarditis, ?related to poor control of HTN.     NYHA class II symptoms.   Continue Entresto 24-26 mg twice a day and spironolactone 25  mg daily.  - Volume status stable. Continue Lasix to 40 mg daily. Hold lasix if weight is less than 297 pounds. Stop potassium.  - Increase coreg to 9.375  Twice a day.   - He has a wide LBBB.  Plan to repeat ECHO next week. If EF remains low, will arrange for CRT-D.  - Unable to fit in CMRI .   2. Smoking: He is cutting back, needs to quit.  Start chantix as this has worked in the past.  Encouraged to quit.  3. OSA: Continue nightly CPAP.   Follow up in 4 weeks with ECHO. Check BMET next visit.    Amy Clegg NP-C  09/29/2015

## 2015-09-29 NOTE — Patient Instructions (Signed)
INCREASE Carvedilol to 9.375 mg , one and one half tab twice a day  Your physician has requested that you have an echocardiogram. Echocardiography is a painless test that uses sound waves to create images of your heart. It provides your doctor with information about the size and shape of your heart and how well your heart's chambers and valves are working. This procedure takes approximately one hour. There are no restrictions for this procedure.  Your physician recommends that you schedule a follow-up appointment in: 4 weeks with Dr. Shirlee Latch  Do the following things EVERYDAY: 1) Weigh yourself in the morning before breakfast. Write it down and keep it in a log. 2) Take your medicines as prescribed 3) Eat low salt foods-Limit salt (sodium) to 2000 mg per day.  4) Stay as active as you can everyday 5) Limit all fluids for the day to less than 2 liters 6)

## 2015-10-29 ENCOUNTER — Encounter (HOSPITAL_COMMUNITY): Payer: Self-pay

## 2015-10-29 ENCOUNTER — Ambulatory Visit (HOSPITAL_BASED_OUTPATIENT_CLINIC_OR_DEPARTMENT_OTHER)
Admission: RE | Admit: 2015-10-29 | Discharge: 2015-10-29 | Disposition: A | Discharge status: Home / Self Care | Payer: BLUE CROSS/BLUE SHIELD | Source: Ambulatory Visit | Attending: Cardiology | Admitting: Cardiology

## 2015-10-29 ENCOUNTER — Ambulatory Visit (HOSPITAL_COMMUNITY)
Admission: RE | Admit: 2015-10-29 | Discharge: 2015-10-29 | Disposition: A | Discharge status: Home / Self Care | Payer: BLUE CROSS/BLUE SHIELD | Source: Ambulatory Visit | Attending: Cardiology | Admitting: Cardiology

## 2015-10-29 VITALS — BP 109/60

## 2015-10-29 VITALS — BP 112/66 | HR 59 | Ht 79.0 in | Wt 296.1 lb

## 2015-10-29 DIAGNOSIS — G4733 Obstructive sleep apnea (adult) (pediatric): Secondary | ICD-10-CM

## 2015-10-29 DIAGNOSIS — I429 Cardiomyopathy, unspecified: Secondary | ICD-10-CM | POA: Diagnosis not present

## 2015-10-29 DIAGNOSIS — Z9989 Dependence on other enabling machines and devices: Secondary | ICD-10-CM

## 2015-10-29 DIAGNOSIS — I340 Nonrheumatic mitral (valve) insufficiency: Secondary | ICD-10-CM | POA: Diagnosis not present

## 2015-10-29 DIAGNOSIS — I5022 Chronic systolic (congestive) heart failure: Secondary | ICD-10-CM

## 2015-10-29 DIAGNOSIS — I77810 Thoracic aortic ectasia: Secondary | ICD-10-CM | POA: Diagnosis not present

## 2015-10-29 DIAGNOSIS — I110 Hypertensive heart disease with heart failure: Secondary | ICD-10-CM | POA: Diagnosis not present

## 2015-10-29 DIAGNOSIS — I447 Left bundle-branch block, unspecified: Secondary | ICD-10-CM | POA: Insufficient documentation

## 2015-10-29 LAB — BASIC METABOLIC PANEL
ANION GAP: 7 (ref 5–15)
BUN: 20 mg/dL (ref 6–20)
CO2: 27 mmol/L (ref 22–32)
Calcium: 9.5 mg/dL (ref 8.9–10.3)
Chloride: 103 mmol/L (ref 101–111)
Creatinine, Ser: 1.13 mg/dL (ref 0.61–1.24)
GLUCOSE: 96 mg/dL (ref 65–99)
POTASSIUM: 4.5 mmol/L (ref 3.5–5.1)
Sodium: 137 mmol/L (ref 135–145)

## 2015-10-29 LAB — BRAIN NATRIURETIC PEPTIDE: B Natriuretic Peptide: 191.2 pg/mL — ABNORMAL HIGH (ref 0.0–100.0)

## 2015-10-29 MED ORDER — CARVEDILOL 12.5 MG PO TABS: 12.5000 mg | ORAL_TABLET | Freq: Two times a day (BID) | ORAL | Status: DC

## 2015-10-29 MED ORDER — PERFLUTREN LIPID MICROSPHERE
1.0000 mL | INTRAVENOUS | Status: AC | PRN
  Administered 2015-10-29: 2 mL via INTRAVENOUS
  Filled 2015-10-29: qty 10

## 2015-10-29 MED ORDER — PERFLUTREN LIPID MICROSPHERE
INTRAVENOUS | Status: AC
  Filled 2015-10-29: qty 10

## 2015-10-29 NOTE — Progress Notes (Signed)
Patient ID: Ethan York, male   DOB: 1976-05-25, 40 y.o.   MRN: [id number]  PCP: Dr. Tommi Rumps [facility]) Cardiology: Dr. Shirlee Latch  40 yo with history of HTN and OSA was admitted to [facility] in 11/16 with several weeks of progressive dyspnea and was found to have acute systolic CHF with EF 20-25% by echo.  No chest pain.  He was diuresed and had left and right heart cath, showing preserved cardiac output with elevated filling pressures and no significant CAD.    He returns for HF follow up. Attempted CMRI but unable to fit in scanner. Echo was done today, EF remains 25% with septal-lateral dyssynchrony.  He is doing well overall.  No dyspnea with walking, going up steps, carrying a load.  No orthopnea/PND.  No chest pain, no palpitations. Smoking 2 cigarettes daily. CPAP nightly. Working full time as Games developer. Takes medications daily.   Labs (11/16): K 3.3, creatinine 1.19, LDL 84, BNP 884, TSH normal Labs (12/16): K 4.3, creatinine 1.06, BNP 123 Labs (07/2015): K 4.8 Creatinine 1.04  Labs (09/2015): Creatinine 1.26   PMH: 1. HTN 2. OSA: Using CPAP  3. GERD 4. Chronic systolic CHF: Nonischemic cardiomyopathy.   - Echo (11/16) with EF 20-25%, diffuse hypokinesis, mild-moderate MR, PASP 35 mmHg, severe LAE.  - LHC/RHC (11/16) with no significant CAD; mean RA 9, PA 50/29 mean 37, mean PCWP 22, CI 2.47, PVR 2.13, LVEDP 30.  - Unable to fit for cardiac MRI. - Echo (3/17) with EF 25%, septal-lateral dyssynchrony, mild to moderately dilated LV, normal RV size and systolic function.  5. LBBB 6. Active smoker  FH: - Brother with MI at 61 - Mother with MI in her 60s - No FH of cardiomyopathy.   SH: Married, lives in [place], Games developer.  He smoked 2.5 ppd, now down to 2-3 cigs/day.  Occasional ETOH.   ROS: All systems reviewed and negative except as per HPI.   Current Outpatient Prescriptions  Medication Sig Dispense Refill  . Aspirin-Acetaminophen (GOODY BODY PAIN) 500-325 MG PACK  Take 1 packet by mouth daily.    Marland Kitchen buPROPion (WELLBUTRIN SR) 150 MG 12 hr tablet Take 1 tab daily for 3 days then take 1 tab Twice daily 60 tablet 3  . carvedilol (COREG) 12.5 MG tablet Take 1 tablet (12.5 mg total) by mouth 2 (two) times daily with a meal. 60 tablet 3  . furosemide (LASIX) 40 MG tablet Take 1 tablet (40 mg total) by mouth daily. HOLD IF WEIGHT IS LESS THAN 295 60 tablet 3  . sacubitril-valsartan (ENTRESTO) 24-26 MG Take 1 tablet by mouth 2 (two) times daily. 60 tablet 11  . spironolactone (ALDACTONE) 25 MG tablet Take 1 tablet (25 mg total) by mouth daily. 30 tablet 3  . varenicline (CHANTIX CONTINUING MONTH PAK) 1 MG tablet Take 1 tablet (1 mg total) by mouth 2 (two) times daily. 60 tablet 3  . varenicline (CHANTIX STARTING MONTH PAK) 0.5 MG X 11 & 1 MG X 42 tablet Take one 0.5 mg tablet by mouth once daily for 3 days, then increase to one 0.5 mg tablet twice daily for 4 days, then increase to one 1 mg tablet twice daily. 53 tablet 0   No current facility-administered medications for this encounter.   BP 112/66 mmHg  Pulse 59  Ht 6\' 7"  (2.007 m)  Wt 296 lb 1.9 oz (134.319 kg)  BMI 33.35 kg/m2  SpO2 100% General: NAD. Walking in the clinic without difficulty.  Neck: No JVD, no thyromegaly or thyroid nodule.  Lungs: Clear to auscultation bilaterally with normal respiratory effort. CV: Nondisplaced PMI.  Heart regular S1/S2, no S3/S4, no murmur.  No carotid bruit.  Normal pedal pulses.  Abdomen: Soft, nontender, no hepatosplenomegaly, no distention.  Skin: Intact without lesions or rashes.  Neurologic: Alert and oriented x 3.  Psych: Normal affect. Extremities: No clubbing or cyanosis. Bilateral lower leg venous varicosities. No edema HEENT: Normal.   Assessment/Plan: 1. Chronic systolic CHF: Nonischemic cardiomyopathy.  EF 20-25% on 11/16 echo, repeat echo in 3/17 with EF 25%, septal-lateral dyssynchrony.  Unable to fit in MRI.  Cause of cardiomyopathy still uncertain.   No significant CAD on coronary angiography.  Has family history of CAD but not cardiomyopathy.  Possible viral myocarditis, also consider LBBB cardiomyopathy. NYHA class I-II symptoms.    - Continue Entresto 24-26 mg twice a day and spironolactone 25 mg daily.  - Volume status stable. Continue Lasix to 40 mg daily.  - Increase Coreg to 12.5 mg bid.    - He has a wide LBBB on ECG with dyssynchrony on echo and persistently low EF.  Given minimal symptoms, I will arrange for CPX.  If function is below average, I would like him to have placement of CRT-D.  I have referred to EP. - BMET/BNP today.  2. Smoking: He is cutting back, needs to quit.  He is taking Chantix.  Encouraged to quit.  3. OSA: Continue nightly CPAP.   [name] 10/29/2015

## 2015-10-29 NOTE — Progress Notes (Signed)
  Echocardiogram 2D Echocardiogram has been performed.  Ethan York 10/29/2015, 10:25 AM

## 2015-10-29 NOTE — Patient Instructions (Signed)
Routine lab work today. Will notify you of abnormal results  Increase Coreg to 12.5mg  twice daily.  Your provider requests you have a cardiopulmonary exercise test( cpx)  Your have been referred to Dr. Graciela Husbands  Follow up with Dr.McLean in 2 months

## 2015-10-29 NOTE — OR Nursing (Signed)
Definity given IV  per protocol without voiced complaints of back discomfort

## 2015-11-09 ENCOUNTER — Encounter (HOSPITAL_COMMUNITY): Payer: BLUE CROSS/BLUE SHIELD

## 2015-11-16 ENCOUNTER — Other Ambulatory Visit (HOSPITAL_COMMUNITY): Payer: Self-pay | Admitting: Cardiology

## 2015-11-16 ENCOUNTER — Ambulatory Visit (INDEPENDENT_AMBULATORY_CARE_PROVIDER_SITE_OTHER): Payer: 59 | Admitting: Internal Medicine

## 2015-11-16 ENCOUNTER — Encounter: Payer: Self-pay | Admitting: Internal Medicine

## 2015-11-16 ENCOUNTER — Other Ambulatory Visit (HOSPITAL_COMMUNITY): Payer: Self-pay | Admitting: *Deleted

## 2015-11-16 ENCOUNTER — Ambulatory Visit (HOSPITAL_COMMUNITY): Payer: 59 | Attending: Internal Medicine

## 2015-11-16 VITALS — BP 108/62 | HR 76 | Ht 79.0 in | Wt 299.0 lb

## 2015-11-16 DIAGNOSIS — I5022 Chronic systolic (congestive) heart failure: Secondary | ICD-10-CM

## 2015-11-16 DIAGNOSIS — F17200 Nicotine dependence, unspecified, uncomplicated: Secondary | ICD-10-CM

## 2015-11-16 MED ORDER — VARENICLINE TARTRATE 1 MG PO TABS: 1.0000 mg | ORAL_TABLET | Freq: Two times a day (BID) | ORAL | Status: DC
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
## 2015-11-16 NOTE — Patient Instructions (Signed)
Medication Instructions: - Your physician recommends that you continue on your current medications as directed. Please refer to the Current Medication list given to you today.  Labwork: - pending procedure date  Procedures/Testing: - Your physician has recommended that you have a defibrillator inserted. An implantable cardioverter defibrillator (ICD) is a small device that is placed in your chest or, in rare cases, your abdomen. This device uses electrical pulses or shocks to help control life-threatening, irregular heartbeats that could lead the heart to suddenly stop beating (sudden cardiac arrest). Leads are attached to the ICD that goes into your heart. This is done in the hospital and usually requires an overnight stay. We will tentatively plan on Friday 12/11/15 for your procedure- Herbert Seta, RN for Dr. Graciela Husbands will call to confirm when scheduling.  Follow-Up: - pending procedure date  Any Additional Special Instructions Will Be Listed Below (If Applicable).     If you need a refill on your cardiac medications before your next appointment, please call your pharmacy.

## 2015-11-17 ENCOUNTER — Other Ambulatory Visit (HOSPITAL_COMMUNITY): Payer: Self-pay | Admitting: Cardiology

## 2015-11-18 NOTE — Telephone Encounter (Signed)
Rx request sent to pharmacy.  

## 2015-11-19 ENCOUNTER — Other Ambulatory Visit (HOSPITAL_COMMUNITY): Payer: Self-pay | Admitting: Cardiology

## 2015-11-25 ENCOUNTER — Telehealth (HOSPITAL_COMMUNITY): Payer: Self-pay | Admitting: Pharmacist

## 2015-11-25 NOTE — Telephone Encounter (Signed)
Mr. Hasbrook is currently on bupropion for smoking cessation so not indicated to also be on Chantix. If fails bupropion, can retry Chantix.   Tyler Deis. Bonnye Fava, PharmD, BCPS, CPP Clinical Pharmacist Pager: [phone] Phone: [phone] 11/25/2015 10:55 AM

## 2015-11-25 NOTE — Telephone Encounter (Signed)
Verified patient only using bupropion for smoking cessation at this time.   Tyler Deis. Bonnye Fava, PharmD, BCPS, CPP Clinical Pharmacist Pager: [phone] Phone: [phone] 11/25/2015 12:59 PM

## 2015-11-26 ENCOUNTER — Telehealth (HOSPITAL_COMMUNITY): Payer: Self-pay | Admitting: Pharmacist

## 2015-11-26 NOTE — Telephone Encounter (Signed)
Entresto PA approved by [facility] through 11/23/16.   Tyler Deis. Bonnye Fava, PharmD, BCPS, CPP Clinical Pharmacist Pager: [phone] Phone: [phone] 11/26/2015 4:42 PM

## 2015-11-27 ENCOUNTER — Telehealth (HOSPITAL_COMMUNITY): Payer: Self-pay | Admitting: Pharmacist

## 2015-11-27 NOTE — Telephone Encounter (Signed)
Called [facility] pharmacy to verify coverage of Sherryll Burger and they stated that it was still not going through insurance. Called [employer] and they gave me a specific NDC to use which was relayed to the pharmacy and it has now adjudicated properly.  Tyler Deis. Bonnye Fava, PharmD, BCPS, CPP Clinical Pharmacist Pager: [phone] Phone: [phone] 11/27/2015 10:59 AM

## 2015-12-01 ENCOUNTER — Telehealth: Payer: Self-pay | Admitting: Internal Medicine

## 2015-12-01 NOTE — Telephone Encounter (Signed)
I called the patient. He is aware that Dr. Graciela Husbands wants a [employer] rep to come to his place of work to take some measurements due to the nature of his job, prior to implant. He is aware I will speak with a [employer] rep and try to let him know something this afternoon.

## 2015-12-01 NOTE — Telephone Encounter (Signed)
Follow Up   Pt request a call back to discuss scheduling a CATH

## 2015-12-03 ENCOUNTER — Encounter: Payer: Self-pay | Admitting: *Deleted

## 2015-12-03 NOTE — Telephone Encounter (Signed)
Late entry- spoke with Bonna Gains ([employer] Rep) on 5/2. He did call and speak with the patient. The plan is for him to meet with the patient at his place of business, [place], [place] on 5/4 at 8:00 am. Judie Grieve will call me back after he meets with the patient.

## 2015-12-03 NOTE — Telephone Encounter (Signed)
Bryan ([address] [employer] rep) called me back today after assessing the patient's work site. Per Judie Grieve, he felt pretty positively that there would be little interference with the patient's Bi-V ICD and the work that he does. He states that once the patient is implanted, he can go back out and check the area with the patient's device in place. I advised Judie Grieve I will notify Dr. Graciela Husbands.  I did go ahead and schedule the patient for his procedure on 12/11/15 at 10:00 am. The patient is aware and verbal instructions were given to him. I advised I will mail a copy to him as well.  He verbalizes understanding.

## 2015-12-11 ENCOUNTER — Encounter (HOSPITAL_COMMUNITY): Payer: Self-pay | Admitting: Internal Medicine

## 2015-12-11 ENCOUNTER — Ambulatory Visit (HOSPITAL_COMMUNITY)
Admission: RE | Admit: 2015-12-11 | Discharge: 2015-12-12 | Disposition: A | Discharge status: Home / Self Care | Payer: 59 | Source: Ambulatory Visit | Attending: Internal Medicine | Admitting: Internal Medicine

## 2015-12-11 ENCOUNTER — Encounter (HOSPITAL_COMMUNITY)
Admission: RE | Disposition: A | Discharge status: Home / Self Care | Payer: Self-pay | Source: Ambulatory Visit | Attending: Internal Medicine

## 2015-12-11 DIAGNOSIS — Z6833 Body mass index (BMI) 33.0-33.9, adult: Secondary | ICD-10-CM | POA: Insufficient documentation

## 2015-12-11 DIAGNOSIS — I5022 Chronic systolic (congestive) heart failure: Secondary | ICD-10-CM | POA: Diagnosis not present

## 2015-12-11 DIAGNOSIS — I447 Left bundle-branch block, unspecified: Secondary | ICD-10-CM | POA: Diagnosis not present

## 2015-12-11 DIAGNOSIS — F17210 Nicotine dependence, cigarettes, uncomplicated: Secondary | ICD-10-CM | POA: Insufficient documentation

## 2015-12-11 DIAGNOSIS — Z7982 Long term (current) use of aspirin: Secondary | ICD-10-CM | POA: Insufficient documentation

## 2015-12-11 DIAGNOSIS — I428 Other cardiomyopathies: Secondary | ICD-10-CM

## 2015-12-11 DIAGNOSIS — I2510 Atherosclerotic heart disease of native coronary artery without angina pectoris: Secondary | ICD-10-CM | POA: Insufficient documentation

## 2015-12-11 DIAGNOSIS — Z959 Presence of cardiac and vascular implant and graft, unspecified: Secondary | ICD-10-CM

## 2015-12-11 DIAGNOSIS — Z9989 Dependence on other enabling machines and devices: Secondary | ICD-10-CM

## 2015-12-11 DIAGNOSIS — F17200 Nicotine dependence, unspecified, uncomplicated: Secondary | ICD-10-CM | POA: Diagnosis present

## 2015-12-11 DIAGNOSIS — Z95810 Presence of automatic (implantable) cardiac defibrillator: Secondary | ICD-10-CM | POA: Diagnosis present

## 2015-12-11 DIAGNOSIS — G4733 Obstructive sleep apnea (adult) (pediatric): Secondary | ICD-10-CM | POA: Insufficient documentation

## 2015-12-11 DIAGNOSIS — I429 Cardiomyopathy, unspecified: Secondary | ICD-10-CM | POA: Diagnosis present

## 2015-12-11 HISTORY — DX: Left bundle-branch block, unspecified: I44.7

## 2015-12-11 HISTORY — DX: Chronic systolic (congestive) heart failure: I50.22

## 2015-12-11 HISTORY — DX: Other cardiomyopathies: I42.8

## 2015-12-11 HISTORY — PX: EP IMPLANTABLE DEVICE: SHX172B

## 2015-12-11 LAB — CBC
HCT: 42.9 % (ref 39.0–52.0)
Hemoglobin: 14.3 g/dL (ref 13.0–17.0)
MCH: 31 pg (ref 26.0–34.0)
MCHC: 33.3 g/dL (ref 30.0–36.0)
MCV: 93.1 fL (ref 78.0–100.0)
PLATELETS: 218 10*3/uL (ref 150–400)
RBC: 4.61 MIL/uL (ref 4.22–5.81)
RDW: 12.2 % (ref 11.5–15.5)
WBC: 7.1 10*3/uL (ref 4.0–10.5)

## 2015-12-11 LAB — BASIC METABOLIC PANEL
Anion gap: 9 (ref 5–15)
BUN: 12 mg/dL (ref 6–20)
CO2: 25 mmol/L (ref 22–32)
CREATININE: 0.96 mg/dL (ref 0.61–1.24)
Calcium: 9.1 mg/dL (ref 8.9–10.3)
Chloride: 106 mmol/L (ref 101–111)
GFR calc non Af Amer: >60 mL/min (ref >60)
GLUCOSE: 96 mg/dL (ref 65–99)
Potassium: 4.1 mmol/L (ref 3.5–5.1)
Sodium: 140 mmol/L (ref 135–145)

## 2015-12-11 LAB — SURGICAL PCR SCREEN
MRSA, PCR: NEGATIVE
Staphylococcus aureus: NEGATIVE

## 2015-12-11 SURGERY — BIV ICD INSERTION CRT-D

## 2015-12-11 MED ORDER — FENTANYL CITRATE (PF) 100 MCG/2ML IJ SOLN
INTRAMUSCULAR | Status: AC
  Filled 2015-12-11: qty 2

## 2015-12-11 MED ORDER — SODIUM CHLORIDE 0.9 % IR SOLN
Status: AC
  Filled 2015-12-11: qty 2

## 2015-12-11 MED ORDER — MIDAZOLAM HCL 5 MG/5ML IJ SOLN
INTRAMUSCULAR | Status: AC
  Filled 2015-12-11: qty 5

## 2015-12-11 MED ORDER — LIDOCAINE HCL (PF) 1 % IJ SOLN
INTRAMUSCULAR | Status: AC
  Filled 2015-12-11: qty 30

## 2015-12-11 MED ORDER — MUPIROCIN 2 % EX OINT
TOPICAL_OINTMENT | CUTANEOUS | Status: AC
  Administered 2015-12-11: 1 via NASAL
  Filled 2015-12-11: qty 22

## 2015-12-11 MED ORDER — HEPARIN (PORCINE) IN NACL 2-0.9 UNIT/ML-% IJ SOLN
INTRAMUSCULAR | Status: AC
  Filled 2015-12-11: qty 500

## 2015-12-11 MED ORDER — ASPIRIN-ACETAMINOPHEN 500-325 MG PO PACK: 1.0000 | PACK | Freq: Every day | ORAL | Status: DC | PRN

## 2015-12-11 MED ORDER — DEXTROSE 5 % IV SOLN
3.0000 g | INTRAVENOUS | Status: AC
  Administered 2015-12-11: 3 g via INTRAVENOUS
  Filled 2015-12-11: qty 3000

## 2015-12-11 MED ORDER — CEFAZOLIN SODIUM-DEXTROSE 2-4 GM/100ML-% IV SOLN
2.0000 g | Freq: Three times a day (TID) | INTRAVENOUS | Status: DC
  Administered 2015-12-11 – 2015-12-12 (×2): 2 g via INTRAVENOUS
  Filled 2015-12-11 (×4): qty 100

## 2015-12-11 MED ORDER — SPIRONOLACTONE 25 MG PO TABS
25.0000 mg | ORAL_TABLET | Freq: Every day | ORAL | Status: DC
  Administered 2015-12-12: 25 mg via ORAL
  Filled 2015-12-11: qty 1

## 2015-12-11 MED ORDER — IOPAMIDOL (ISOVUE-370) INJECTION 76%
INTRAVENOUS | Status: AC
  Filled 2015-12-11: qty 50

## 2015-12-11 MED ORDER — ONDANSETRON HCL 4 MG/2ML IJ SOLN: 4.0000 mg | Freq: Four times a day (QID) | INTRAMUSCULAR | Status: DC | PRN

## 2015-12-11 MED ORDER — ACETAMINOPHEN 325 MG PO TABS
325.0000 mg | ORAL_TABLET | ORAL | Status: DC | PRN
  Administered 2015-12-11: 650 mg via ORAL
  Filled 2015-12-11: qty 2

## 2015-12-11 MED ORDER — SODIUM CHLORIDE 0.9 % IV SOLN
INTRAVENOUS | Status: DC
  Administered 2015-12-11: 10:00:00 via INTRAVENOUS

## 2015-12-11 MED ORDER — SACUBITRIL-VALSARTAN 24-26 MG PO TABS
1.0000 | ORAL_TABLET | Freq: Two times a day (BID) | ORAL | Status: DC
  Administered 2015-12-11 – 2015-12-12 (×2): 1 via ORAL
  Filled 2015-12-11 (×2): qty 1

## 2015-12-11 MED ORDER — MIDAZOLAM HCL 5 MG/5ML IJ SOLN
INTRAMUSCULAR | Status: DC | PRN
  Administered 2015-12-11 (×6): 1 mg via INTRAVENOUS

## 2015-12-11 MED ORDER — CARVEDILOL 12.5 MG PO TABS
12.5000 mg | ORAL_TABLET | Freq: Two times a day (BID) | ORAL | Status: DC
  Administered 2015-12-11 – 2015-12-12 (×2): 12.5 mg via ORAL
  Filled 2015-12-11 (×2): qty 1

## 2015-12-11 MED ORDER — SODIUM CHLORIDE 0.9 % IV SOLN: INTRAVENOUS | Status: AC

## 2015-12-11 MED ORDER — SODIUM CHLORIDE 0.9 % IR SOLN
80.0000 mg | Status: AC
  Administered 2015-12-11: 80 mg

## 2015-12-11 MED ORDER — FENTANYL CITRATE (PF) 100 MCG/2ML IJ SOLN
INTRAMUSCULAR | Status: DC | PRN
  Administered 2015-12-11 (×5): 25 ug via INTRAVENOUS

## 2015-12-11 MED ORDER — HEPARIN (PORCINE) IN NACL 2-0.9 UNIT/ML-% IJ SOLN
INTRAMUSCULAR | Status: DC | PRN
  Administered 2015-12-11: 12:00:00

## 2015-12-11 MED ORDER — FUROSEMIDE 40 MG PO TABS
40.0000 mg | ORAL_TABLET | Freq: Every day | ORAL | Status: DC
  Administered 2015-12-11 – 2015-12-12 (×2): 40 mg via ORAL
  Filled 2015-12-11 (×2): qty 1

## 2015-12-11 MED ORDER — LIDOCAINE HCL (PF) 1 % IJ SOLN
INTRAMUSCULAR | Status: DC | PRN
  Administered 2015-12-11: 75 mL via INTRADERMAL

## 2015-12-11 MED ORDER — ADULT MULTIVITAMIN W/MINERALS CH
1.0000 | ORAL_TABLET | Freq: Every day | ORAL | Status: DC
  Administered 2015-12-12: 1 via ORAL
  Filled 2015-12-11: qty 1

## 2015-12-11 MED ORDER — KETOROLAC TROMETHAMINE 30 MG/ML IJ SOLN
30.0000 mg | Freq: Four times a day (QID) | INTRAMUSCULAR | Status: DC | PRN
  Administered 2015-12-11 – 2015-12-12 (×3): 30 mg via INTRAVENOUS
  Filled 2015-12-11 (×3): qty 1

## 2015-12-11 MED ORDER — IOPAMIDOL (ISOVUE-370) INJECTION 76%
INTRAVENOUS | Status: DC | PRN
  Administered 2015-12-11: 20 mL

## 2015-12-11 MED ORDER — SODIUM CHLORIDE 0.9 % IV SOLN
INTRAVENOUS | Status: DC
Start: 2015-12-11 — End: 2015-12-11
  Administered 2015-12-11: 10:00:00 via INTRAVENOUS

## 2015-12-11 MED ORDER — BUPROPION HCL ER (SR) 150 MG PO TB12
150.0000 mg | ORAL_TABLET | Freq: Every day | ORAL | Status: DC
  Administered 2015-12-11 – 2015-12-12 (×2): 150 mg via ORAL
  Filled 2015-12-11 (×2): qty 1

## 2015-12-11 SURGICAL SUPPLY — 20 items
ASSURA CRTD CD3369-40C (ICD Generator) ×3 IMPLANT
BALLN COR SINUS VENO 6F 80CM (BALLOONS) ×1
BALLN COR SINUS VENO 6FR 80 (BALLOONS) ×2
BALLOON COR SINUS VENO 6FR 80 (BALLOONS) ×1 IMPLANT
CABLE SURGICAL S-101-97-12 (CABLE) ×3 IMPLANT
CATH CPS DIRECT 135 DS2C020 (CATHETERS) ×3 IMPLANT
DEFIB ASSURA CRT-D (ICD Generator) ×1 IMPLANT
HEMOSTAT SURGICEL 2X4 FIBR (HEMOSTASIS) ×3 IMPLANT
KIT ESSENTIALS PG (KITS) ×3 IMPLANT
LEAD DURATA 7122-65CM (Lead) ×3 IMPLANT
LEAD QUARTET 1458Q-86CM (Lead) ×3 IMPLANT
LEAD TENDRIL MRI 52CM LPA1200M (Lead) ×3 IMPLANT
PAD DEFIB LIFELINK (PAD) ×3 IMPLANT
SHEATH CLASSIC 8F (SHEATH) ×6 IMPLANT
SHEATH CLASSIC 9.5F (SHEATH) ×3 IMPLANT
SHIELD RADPAD SCOOP 12X17 (MISCELLANEOUS) ×3 IMPLANT
SLITTER UNIVERSAL DS2A003 (MISCELLANEOUS) ×3 IMPLANT
TRAY PACEMAKER INSERTION (PACKS) ×3 IMPLANT
WIRE ACUITY WHISPER EDS 4648 (WIRE) ×3 IMPLANT
WIRE HI TORQ VERSACORE-J 145CM (WIRE) ×3 IMPLANT

## 2015-12-11 NOTE — Progress Notes (Signed)
12/11/2015 1400 Received to room 2W02 a pt from cath lab with ICD placement.  Pt is A&Ox4, no c/o voiced.  Tele monitor applied and CCMD notified.  Encouraged pt to not use the left arm and to only getup if he needs to use the bathroom.  Oriented to room, call light and bed.  Call bell in reach, family at bedside. Kathryne Hitch
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
## 2015-12-11 NOTE — Interval H&P Note (Signed)
ICD Criteria  Current LVEF:25%. Within 12 months prior to implant: Yes   Heart failure history: Yes, Class III  Cardiomyopathy history: Yes, Non-Ischemic Cardiomyopathy.  Atrial Fibrillation/Atrial Flutter: No.  Ventricular tachycardia history: No.  Cardiac arrest history: No.  History of syndromes with risk of sudden death: No.  Previous ICD: No.  Current ICD indication: Primary  PPM indication: No.   Class I or II Bradycardia indication present: No  Beta Blocker therapy for 3 or more months: Yes, prescribed.   Ace Inhibitor/ARB therapy for 3 or more months: Yes, prescribed.   History and Physical Interval Note:  12/11/2015 10:43 AM  Ethan York  has presented today for surgery, with the diagnosis of cm/hf  The various methods of treatment have been discussed with the patient and family. After consideration of risks, benefits and other options for treatment, the patient has consented to  Procedure(s): BiV ICD Insertion CRT-D (N/A) as a surgical intervention .  The patient's history has been reviewed, patient examined, no change in status, stable for surgery.  I have reviewed the patient's chart and labs.  Questions were answered to the patient's satisfaction.     Sherryl Manges

## 2015-12-11 NOTE — Discharge Instructions (Signed)
° ° °  Supplemental Discharge Instructions for  °Pacemaker/Defibrillator Patients ° °Activity °No heavy lifting or vigorous activity with your left/right arm for 6 to 8 weeks.  Do not raise your left/right arm above your head for one week.  Gradually raise your affected arm as drawn below. ° °        °    12/15/15                    12/16/15                    12/17/15                  12/18/15 °__ ° °NO DRIVING for  1 week   ; you may begin driving on  11/1915    . ° °WOUND CARE °- Keep the wound area clean and dry.  Do not get this area wet for 24 hours. No showers for 24 hours; you may shower on 12/12/15     . °- The tape/steri-strips on your wound will fall off; do not pull them off.  No bandage is needed on the site.  DO  NOT apply any creams, oils, or ointments to the wound area. °- If you notice any drainage or discharge from the wound, any swelling or bruising at the site, or you develop a fever > 101? F after you are discharged home, call the office at once. ° °Special Instructions °- You are still able to use cellular telephones; use the ear opposite the side where you have your pacemaker/defibrillator.  Avoid carrying your cellular phone near your device. °- When traveling through airports, show security personnel your identification card to avoid being screened in the metal detectors.  Ask the security personnel to use the hand wand. °- Avoid arc welding equipment, MRI testing (magnetic resonance imaging), TENS units (transcutaneous nerve stimulators).  Call the office for questions about other devices. °- Avoid electrical appliances that are in poor condition or are not properly grounded. °- Microwave ovens are safe to be near or to operate. ° °Additional information for defibrillator patients should your device go off: °- If your device goes off ONCE and you feel fine afterward, notify the device clinic nurses. °- If your device goes off ONCE and you do not feel well afterward, call 911. °- If your device  goes off TWICE, call 911. °- If your device goes off THREE times in one day, call 911. ° °DO NOT DRIVE YOURSELF OR A FAMILY MEMBER °WITH A DEFIBRILLATOR TO THE HOSPITAL--CALL 911. ° °

## 2015-12-12 ENCOUNTER — Encounter (HOSPITAL_COMMUNITY): Payer: Self-pay | Admitting: Internal Medicine

## 2015-12-12 ENCOUNTER — Ambulatory Visit (HOSPITAL_COMMUNITY): Payer: 59

## 2015-12-12 DIAGNOSIS — I2510 Atherosclerotic heart disease of native coronary artery without angina pectoris: Secondary | ICD-10-CM | POA: Diagnosis not present

## 2015-12-12 DIAGNOSIS — G4733 Obstructive sleep apnea (adult) (pediatric): Secondary | ICD-10-CM | POA: Diagnosis not present

## 2015-12-12 DIAGNOSIS — I429 Cardiomyopathy, unspecified: Secondary | ICD-10-CM | POA: Diagnosis not present

## 2015-12-12 DIAGNOSIS — I447 Left bundle-branch block, unspecified: Secondary | ICD-10-CM | POA: Diagnosis not present

## 2015-12-12 DIAGNOSIS — Z95810 Presence of automatic (implantable) cardiac defibrillator: Secondary | ICD-10-CM | POA: Diagnosis present

## 2015-12-12 MED ORDER — ACETAMINOPHEN 325 MG PO TABS: 325.0000 mg | ORAL_TABLET | ORAL | Status: DC | PRN

## 2015-12-12 MED ORDER — BUPROPION HCL ER (SR) 150 MG PO TB12: ORAL_TABLET | ORAL | Status: DC

## 2015-12-12 MED ORDER — OXYCODONE-ACETAMINOPHEN 5-325 MG PO TABS: 1.0000 | ORAL_TABLET | Freq: Four times a day (QID) | ORAL | Status: DC | PRN

## 2015-12-12 NOTE — Op Note (Signed)
Ethan York, Ethan York NO.:  [id number]  MEDICAL RECORD NO.:  [id number]  LOCATION:  2W02C                        FACILITY:  [facility]  PHYSICIAN:  Duke Salvia, MD, FACCDATE OF BIRTH:  06-25-1976  DATE OF PROCEDURE:  12/11/2015 DATE OF DISCHARGE:                              OPERATIVE REPORT   PREOPERATIVE DIAGNOSES:  Nonischemic cardiomyopathy.  Congestive heart failure.  Left bundle-branch block.  POSTOPERATIVE DIAGNOSES:  Nonischemic cardiomyopathy.  Congestive heart failure.  Left bundle-branch block.  PROCEDURE:  Dual-chamber defibrillator implantation with LV lead placement and high-voltage lead testing.  DESCRIPTION OF PROCEDURE:  On obtaining informed consent, the patient was brought to electrophysiology laboratory and placed on the fluoroscopic table in supine position.  After routine prep and drape in the left upper chest, lidocaine was infiltrated in prepectoral subclavicular region.  Incision was made and carried down to layer of the prepectoral fascia with electrocautery and sharp dissection.  A pocket was formed similarly, hemostasis was obtained.  Thereafter, attention was turned to gain access to the extrathoracic left subclavian vein which was accomplished without difficulty without the aspiration or puncture of the artery.  Three separate venipunctures were accomplished.  Guidewires were placed and retained sequentially in 8, 9 and 8-French sheaths were placed, which were passing St. Jude 71, 22, 65 cm active fixation, single coil defibrillator lead, serial number [id number], a St. Jude 135 CS cannulation sheath and a St. Jude LPA1200 52-cm active fixation atrial lead serial number [id number].  Under fluoroscopic guidance, the RV lead was manipulated to the apex with bipolar R-wave of 23 with a pacing impedance of 627 threshold 1.5 V at 0.5 milliseconds.  Current threshold was 2.3 mA and there is no diaphragmatic pacing at 10 V __________  brisk.  This lead was secured to the prepectoral fascia.  We then gained access to the coronary artery sinus and took a balloon venogram demonstrating a high lateral branch.  This was targeted and a 1458 lead serial number [id number] was placed at the junction between the mid and distal third.  In this location the bipolar L wave in a 2-3 configuration with a QLV of 105 milliseconds.  Had an amplitude of 15.7 with a pace impedance of a 1000 ohms with threshold 1.8 V at 0.5 milliseconds.  There is no diaphragmatic pacing at 10 V.  The QLV of the fourth pole turned out to be 130 and this was subsequently used.  Prior to the deployment of the atrial lead, the 9-French sheath was removed.  The atrial lead was then deployed in the right atrial appendage __________ P-wave of 3.5 with the pace impedance of 909, a threshold 2.9 V at 0.5 milliseconds, __________ threshold of 3.1 mA, __________ injury is brisk and there is no diaphragmatic pacing at 10 V. This lead was secured to the prepectoral fascia.  The CS delivery system was removed and CS lead was secured.  The leads were then attached to a St. Jude Quattro device, serial number [id number].  Through the device, bipolar P-wave was 4.2 with a pace impedance of 30, threshold 2.75 at 0.5.  The R-wave was greater than 12 with a pace impedance of 660,  a threshold 0.5 at 0.5 and the LV impedance was 840 with a threshold of 2.25 at 0.5 in a 3-4 configuration.  High-voltage impedance was 86 ohms.  With the acceptable parameters recorded, the pocket was copiously irrigated with antibiotic-containing saline solution.  Hemostasis was assured.  The leads and the pulse generator were placed in the pocket secured to the prepectoral fascia.  Surgicel was placed on the posterior and cephalad rim of the pocket.  The pocket was closed in 2 layers.  A Dermabond dressing was applied.  Needle counts, sponge counts, and instrument counts were correct at  the end of the procedure, according to the staff.  The patient tolerated the procedure without apparent complication.     Duke Salvia, MD, [facility]     SCK/MEDQ  D:  12/11/2015  T:  12/12/2015  Job:  [id number]

## 2015-12-12 NOTE — Progress Notes (Signed)
Pt/family given discharge instructions, medication lists, follow up appointments, and when to call the doctor.  Pt/family verbalizes understanding. Pt given signs and symptoms of infection. Rockford Leinen McClintock, RN    

## 2015-12-12 NOTE — Discharge Summary (Signed)
     Patient ID: Ethan York,  MRN: [id number], DOB/AGE: June 25, 1976 40 y.o.  Admit date: 12/11/2015 Discharge date: 12/12/2015  Primary Care Provider: Jobe Marker Primary Cardiologist: Dr McLean/ Dr Graciela Husbands  Discharge Diagnoses Principal Problem:   NICM (nonischemic cardiomyopathy) [facility]) Active Problems:   Biventricular ICD implanted 12/11/15   Chronic systolic CHF (congestive heart failure) (HCC)   LBBB (left bundle branch block)   Smoking   OSA on CPAP    Procedures: BiV ICD implant 12/11/15   Hospital Course:  40 y/o male with a history of NICM diagnosed in Nov 2016. Cath showed non obstructive CAD. He is obese and MRI was not able to be done. F/U echo on medical Rx in March 2017 showed his EF to be 25%. He was admitted 12/11/15 for elective BiV ICD implant. This was done without complications by Dr Graciela Husbands. The pt is for discharge 12/12/15. F/U has been arranged.   Discharge Vitals:  Blood pressure 126/73, pulse 53, temperature 98.1 F (36.7 C), temperature source Oral, resp. rate 18, height 6\' 7"  (2.007 m), weight 297 lb 12.8 oz (135.081 kg), SpO2 100 %.    Labs: No results found for this or any previous visit (from the past 24 hour(s)).  Disposition:      Follow-up Information    Follow up with [facility] On 12/21/2015.   Specialty:  Cardiology   Why:  9:00AM, wound check   Contact information:   [address] [place] [address] [phone]      Follow up with Sherryl Manges, MD On 03/22/2016.   Specialty:  Cardiology   Why:  2:00PM   Contact information:   [address] [place] [address] [phone]       Discharge Medications:    Medication List    TAKE these medications        acetaminophen 325 MG tablet  Commonly known as:  TYLENOL  Take 1-2 tablets (325-650 mg total) by mouth every 4 (four) hours as needed for mild pain.     buPROPion 150 MG 12 hr tablet  Commonly known  as:  WELLBUTRIN SR  TAKE 1 TAB (150 mg) TWICE DAILY     carvedilol 12.5 MG tablet  Commonly known as:  COREG  Take 1 tablet (12.5 mg total) by mouth 2 (two) times daily with a meal.     furosemide 40 MG tablet  Commonly known as:  LASIX  Take 1 tablet (40 mg total) by mouth daily. HOLD IF WEIGHT IS LESS THAN 295     GOODY BODY PAIN 500-325 MG Pack  Generic drug:  Aspirin-Acetaminophen  Take 1 packet by mouth daily as needed (pain).     multivitamin Tabs tablet  Take 1 tablet by mouth daily.     oxyCODONE-acetaminophen 5-325 MG tablet  Commonly known as:  ROXICET  Take 1-2 tablets by mouth every 6 (six) hours as needed for severe pain.     sacubitril-valsartan 24-26 MG  Commonly known as:  ENTRESTO  Take 1 tablet by mouth 2 (two) times daily.     spironolactone 25 MG tablet  Commonly known as:  ALDACTONE  Take 1 tablet (25 mg total) by mouth daily.         Duration of Discharge Encounter: Greater than 30 minutes including physician time.  Jolene Provost PA-C 12/12/2015 10:36 AM

## 2015-12-12 NOTE — Progress Notes (Signed)
       Patient Name: Ethan York      SUBJECTIVE:shoulder pain  Otherwise without complaint  Past Medical History  Diagnosis Date  . Chronic systolic CHF (congestive heart failure) (HCC) 07/04/2015  . NICM (nonischemic cardiomyopathy) (HCC) 12/11/2015  . LBBB (left bundle branch block) 12/11/2015    Scheduled Meds:  Scheduled Meds: . buPROPion  150 mg Oral Daily  . carvedilol  12.5 mg Oral BID WC  .  ceFAZolin (ANCEF) IV  2 g Intravenous Q8H  . furosemide  40 mg Oral Daily  . multivitamin with minerals  1 tablet Oral Daily  . sacubitril-valsartan  1 tablet Oral BID  . spironolactone  25 mg Oral Daily   Continuous Infusions:  acetaminophen, ketorolac, ondansetron (ZOFRAN) IV    PHYSICAL EXAM Filed Vitals:   12/11/15 1325 12/11/15 1400 12/11/15 2106 12/12/15 0544  BP: 122/73 111/66 99/59 126/73  Pulse: 83 62 61 53  Temp:  97.8 F (36.6 C) 98.2 F (36.8 C) 98.1 F (36.7 C)  TempSrc:  Oral Oral Oral  Resp: 10 16 18 18   Height:      Weight:    297 lb 12.8 oz (135.081 kg)  SpO2: 98% 99% 97% 100%    Well developed and nourished in no acute distress HENT normal Neck supple with JVP-flat Clear Regular rate and rhythm, no murmurs or gallops Abd-soft with active BS No Clubbing cyanosis edema Skin-warm and dry A & Oriented  Grossly normal sensory and motor function Pocket without hematoma, swelling or tenderness   TELEMETRY: Reviewed telemetry pt in P-synchronous/ AV  pacing :    Intake/Output Summary (Last 24 hours) at 12/12/15 0920 Last data filed at 12/11/15 1700  Gross per 24 hour  Intake    480 ml  Output      0 ml  Net    480 ml    LABS: Basic Metabolic Panel:  Recent Labs Lab 12/11/15 0915  NA 140  K 4.1  CL 106  CO2 25  GLUCOSE 96  BUN 12  CREATININE 0.96  CALCIUM 9.1   Cardiac Enzymes: No results for input(s): CKTOTAL, CKMB, CKMBINDEX, TROPONINI in the last 72 hours. CBC:  Recent Labs Lab 12/11/15 0915  WBC 7.1  HGB 14.3    HCT 42.9  MCV 93.1  PLT 218   PROTIME: No results for input(s): LABPROT, INR in the last 72 hours. Liver Function Tests: No results for input(s): AST, ALT, ALKPHOS, BILITOT, PROT, ALBUMIN in the last 72 hours. No results for input(s): LIPASE, AMYLASE in the last 72 hours. BNP: BNP (last 3 results)  Recent Labs  07/03/15 0938 07/20/15 0930 10/29/15 1114  BNP 122.5* 184.8* 191.2*     Device Interrogation: device function cxr  Good lead position    ASSESSMENT AND PLAN:  Active Problems:   Chronic systolic CHF (congestive heart failure) (HCC)   NICM (nonischemic cardiomyopathy) (HCC)   LBBB (left bundle branch block)  Will need vicodin for [pain Instructions given  Signed, Sherryl Manges MD  12/12/2015

## 2015-12-15 ENCOUNTER — Telehealth: Payer: Self-pay

## 2015-12-15 NOTE — Telephone Encounter (Signed)
Will forward to Device Clinic- did anyone receive a transferred call from this patient?

## 2015-12-15 NOTE — Telephone Encounter (Signed)
Late entry. Patient calling about pain at incision site from ICD placement 12/11/15. Patient denies pain elsewhere other than right around device incision. He denies redness, swelling or drainage from incision as well as fever/chills. He reports that his wife heard that the pain should subside within 48hrs after implant. I advised him that he may have pain 2 or more weeks after procedure, it varies with individual patients. He was advised to manage this pain with OTC tylenol. I offered a device clinic appt if he was concerned about the device site appearance, he declined. He is scheduled for wound check 12/21/15 and was advised to call back if he noted redness, swelling, drainage, fever or chills. He verbalizes understanding.

## 2015-12-21 ENCOUNTER — Encounter: Payer: Self-pay | Admitting: Internal Medicine

## 2015-12-21 ENCOUNTER — Ambulatory Visit (HOSPITAL_COMMUNITY)
Admission: RE | Admit: 2015-12-21 | Discharge: 2015-12-21 | Disposition: A | Discharge status: Home / Self Care | Payer: 59 | Source: Ambulatory Visit | Attending: Internal Medicine | Admitting: Internal Medicine

## 2015-12-21 ENCOUNTER — Ambulatory Visit (INDEPENDENT_AMBULATORY_CARE_PROVIDER_SITE_OTHER): Payer: 59 | Admitting: *Deleted

## 2015-12-21 DIAGNOSIS — Z95810 Presence of automatic (implantable) cardiac defibrillator: Secondary | ICD-10-CM | POA: Diagnosis not present

## 2015-12-21 DIAGNOSIS — I5022 Chronic systolic (congestive) heart failure: Secondary | ICD-10-CM

## 2015-12-21 LAB — CUP PACEART INCLINIC DEVICE CHECK
Battery Remaining Longevity: 44.4
Brady Statistic RA Percent Paced: 7.5 %
Brady Statistic RV Percent Paced: 99.8 %
HighPow Impedance: 70.875
Implantable Lead Implant Date: 20170512
Implantable Lead Location: 753858
Implantable Lead Model: 7122
Lead Channel Impedance Value: 375 Ohm
Lead Channel Impedance Value: 600 Ohm
Lead Channel Impedance Value: 662.5 Ohm
Lead Channel Pacing Threshold Amplitude: 0.75 V
Lead Channel Pacing Threshold Amplitude: 2 V
Lead Channel Sensing Intrinsic Amplitude: 12 mV
Lead Channel Sensing Intrinsic Amplitude: 5 mV
Lead Channel Setting Pacing Amplitude: 2 V
Lead Channel Setting Pacing Pulse Width: 1 ms
MDC IDC LEAD IMPLANT DT: 20170512
MDC IDC LEAD IMPLANT DT: 20170512
MDC IDC LEAD LOCATION: 753859
MDC IDC LEAD LOCATION: 753860
MDC IDC MSMT LEADCHNL LV PACING THRESHOLD AMPLITUDE: 3.25 V
MDC IDC MSMT LEADCHNL LV PACING THRESHOLD PULSEWIDTH: 1 ms
MDC IDC MSMT LEADCHNL LV PACING THRESHOLD PULSEWIDTH: 1 ms
MDC IDC MSMT LEADCHNL RA PACING THRESHOLD AMPLITUDE: 0.75 V
MDC IDC MSMT LEADCHNL RA PACING THRESHOLD AMPLITUDE: 0.75 V
MDC IDC MSMT LEADCHNL RA PACING THRESHOLD PULSEWIDTH: 0.5 ms
MDC IDC MSMT LEADCHNL RA PACING THRESHOLD PULSEWIDTH: 0.5 ms
MDC IDC MSMT LEADCHNL RV PACING THRESHOLD PULSEWIDTH: 0.5 ms
MDC IDC PG SERIAL: 7355027
MDC IDC SESS DTM: 20170522124448
MDC IDC SET LEADCHNL LV PACING AMPLITUDE: 2.5 V
MDC IDC SET LEADCHNL RA PACING AMPLITUDE: 3.5 V
MDC IDC SET LEADCHNL RV PACING PULSEWIDTH: 0.5 ms
MDC IDC SET LEADCHNL RV SENSING SENSITIVITY: 0.5 mV

## 2015-12-21 NOTE — Progress Notes (Signed)
Wound check appointment. Dermabond removed. Wound without redness or edema. Incision edges approximated, wound well healed. Normal device function. RA/RV thresholds, sensing, and impedances consistent with implant measurements. Elevated LV threshold noted--now 6V@0 .67ms (M3-P4)--LV pacing config reprogrammed to M2-RVc--output fixed at 2.5V @1 .21ms (+0.5V safety margin)--no d.stim noted after reprogramming. Patient sent for cxr. Device programmed at 3.5V and with auto cap on for extra safety until 3 month office visit. Histogram distribution appropriate for patient and level of activity. No mode switches or ventricular arrhythmias noted. Patient educated about wound care, arm mobility, lifting restrictions, shock plan. ROV in 3 months with SK

## 2015-12-24 ENCOUNTER — Encounter: Payer: Self-pay | Admitting: Nurse Practitioner

## 2015-12-24 NOTE — Progress Notes (Signed)
Pt works as a Location manager was checked today by industry at place of patient's employment to look for electrical interference.  No interference with routine job duties and ICD sensing.  Ok for patient to return to work without restrictions after visit with Dr Shirlee Latch 12/29/15.  Gypsy Balsam, NP 12/24/2015 3:17 PM

## 2015-12-29 ENCOUNTER — Ambulatory Visit (HOSPITAL_COMMUNITY)
Admission: RE | Admit: 2015-12-29 | Discharge: 2015-12-29 | Disposition: A | Discharge status: Home / Self Care | Payer: 59 | Source: Ambulatory Visit | Attending: Cardiology | Admitting: Cardiology

## 2015-12-29 VITALS — BP 102/54 | HR 75 | Wt 300.2 lb

## 2015-12-29 DIAGNOSIS — I5022 Chronic systolic (congestive) heart failure: Secondary | ICD-10-CM | POA: Insufficient documentation

## 2015-12-29 DIAGNOSIS — K219 Gastro-esophageal reflux disease without esophagitis: Secondary | ICD-10-CM | POA: Diagnosis not present

## 2015-12-29 DIAGNOSIS — Z8249 Family history of ischemic heart disease and other diseases of the circulatory system: Secondary | ICD-10-CM | POA: Diagnosis not present

## 2015-12-29 DIAGNOSIS — I428 Other cardiomyopathies: Secondary | ICD-10-CM | POA: Diagnosis not present

## 2015-12-29 DIAGNOSIS — I110 Hypertensive heart disease with heart failure: Secondary | ICD-10-CM | POA: Insufficient documentation

## 2015-12-29 DIAGNOSIS — G4733 Obstructive sleep apnea (adult) (pediatric): Secondary | ICD-10-CM | POA: Insufficient documentation

## 2015-12-29 DIAGNOSIS — Z79899 Other long term (current) drug therapy: Secondary | ICD-10-CM | POA: Insufficient documentation

## 2015-12-29 DIAGNOSIS — F17210 Nicotine dependence, cigarettes, uncomplicated: Secondary | ICD-10-CM | POA: Diagnosis not present

## 2015-12-29 DIAGNOSIS — Z95810 Presence of automatic (implantable) cardiac defibrillator: Secondary | ICD-10-CM | POA: Diagnosis not present

## 2015-12-29 MED ORDER — CARVEDILOL 12.5 MG PO TABS: 18.7500 mg | ORAL_TABLET | Freq: Two times a day (BID) | ORAL | Status: DC

## 2015-12-29 NOTE — Patient Instructions (Signed)
Increase Carvedilol to 18.75 mg (1 & 1/2 tabs) Twice daily   Your physician recommends that you schedule a follow-up appointment in: 3 months with echocardiogram

## 2015-12-29 NOTE — Progress Notes (Signed)
Patient ID: Ethan York, male   DOB: 1976/05/12, 40 y.o.   MRN: [id number]  PCP: Dr. Tommi York [facility]) Cardiology: Dr. Shirlee Latch  40 yo with history of HTN and OSA was admitted to [facility] in 11/16 with several weeks of progressive dyspnea and was found to have acute systolic CHF with EF 20-25% by echo.  No chest pain.  He was diuresed and had left and right heart cath, showing preserved cardiac output with elevated filling pressures and no significant CAD.  Echo 3/17 with persistently decreased EF, 25%.  CPX 4/17 with moderate functional limitation due to heart failure.  He had St Jude CRT-D placed in 5/17.   Stable today.  Reports no exertional dyspnea.  Able to climb a flight of steps without problems.  No lightheadedness.  No orthopnea/PND.  No chest pain, no palpitations. Smoking 2-3 cigarettes daily. CPAP nightly. Working full time as Games developer. Takes medications daily.   Labs (11/16): K 3.3, creatinine 1.19, LDL 84, BNP 884, TSH normal Labs (12/16): K 4.3, creatinine 1.06, BNP 123 Labs (07/2015): K 4.8 Creatinine 1.04  Labs (09/2015): Creatinine 1.26  Labs (5/17): K 4.1, creatinine 0.96  PMH: 1. HTN 2. OSA: Using CPAP  3. GERD 4. Chronic systolic CHF: Nonischemic cardiomyopathy.   - Echo (11/16) with EF 20-25%, diffuse hypokinesis, mild-moderate MR, PASP 35 mmHg, severe LAE.  - LHC/RHC (11/16) with no significant CAD; mean RA 9, PA 50/29 mean 37, mean PCWP 22, CI 2.47, PVR 2.13, LVEDP 30.  - Unable to fit for cardiac MRI. - Echo (3/17) with EF 25%, septal-lateral dyssynchrony, mild to moderately dilated LV, normal RV size and systolic function.  - CPX (4/17): peak VO2 16.6 (54% predicted), VE/VCO2 slope 25, RER 1.13 => moderate HF limitation.  - 5/17 St Jude CRT-D placed.  5. LBBB 6. Active smoker  FH: - Brother with MI at 37 - Mother with MI in her 59s - No FH of cardiomyopathy.   SH: Married, lives in [place], Games developer.  He smoked 2.5 ppd, now down to 2-3 cigs/day.   Occasional ETOH.   ROS: All systems reviewed and negative except as per HPI.   Current Outpatient Prescriptions  Medication Sig Dispense Refill  . acetaminophen (TYLENOL) 325 MG tablet Take 1-2 tablets (325-650 mg total) by mouth every 4 (four) hours as needed for mild pain.    . Aspirin-Acetaminophen (GOODY BODY PAIN) 500-325 MG PACK Take 1 packet by mouth daily as needed (pain).     Marland Kitchen buPROPion (WELLBUTRIN SR) 150 MG 12 hr tablet TAKE 1 TAB (150 mg) TWICE DAILY 60 tablet 3  . carvedilol (COREG) 12.5 MG tablet Take 1.5 tablets (18.75 mg total) by mouth 2 (two) times daily with a meal. 90 tablet 3  . furosemide (LASIX) 40 MG tablet Take 1 tablet (40 mg total) by mouth daily. HOLD IF WEIGHT IS LESS THAN 295 60 tablet 3  . multivitamin (ONE-A-DAY MEN'S) TABS tablet Take 1 tablet by mouth daily.    . sacubitril-valsartan (ENTRESTO) 24-26 MG Take 1 tablet by mouth 2 (two) times daily. 60 tablet 11  . spironolactone (ALDACTONE) 25 MG tablet Take 1 tablet (25 mg total) by mouth daily. 30 tablet 3   No current facility-administered medications for this encounter.   BP 102/54 mmHg  Pulse 75  Wt 300 lb 4 oz (136.193 kg)  SpO2 98% General: NAD. Walking in the clinic without difficulty.  Neck: No JVD, no thyromegaly or thyroid nodule.  Lungs: Clear to  auscultation bilaterally with normal respiratory effort. CV: Nondisplaced PMI.  Heart regular S1/S2, no S3/S4, no murmur.  No carotid bruit.  Normal pedal pulses.  Abdomen: Soft, nontender, no hepatosplenomegaly, no distention.  Skin: Intact without lesions or rashes.  Neurologic: Alert and oriented x 3.  Psych: Normal affect. Extremities: No clubbing or cyanosis. Bilateral lower leg venous varicosities. No edema HEENT: Normal.   Assessment/Plan: 1. Chronic systolic CHF: Nonischemic cardiomyopathy.  EF 20-25% on 11/16 echo, repeat echo in 3/17 with EF 25%, septal-lateral dyssynchrony.  Unable to fit in MRI.  Cause of cardiomyopathy still  uncertain.  No significant CAD on coronary angiography.  Has family history of CAD but not cardiomyopathy.  Possible viral myocarditis, also consider LBBB cardiomyopathy. NYHA class II symptoms based on recent CPX.   He now has Secondary school teacher CRT-D device.  - Continue Entresto 24-26 mg twice a day and spironolactone 25 mg daily.  - Volume status stable. Continue Lasix to 40 mg daily.  - Increase Coreg to 18.75 mg bid.    - Followup in 3 months with echo to look for improvement post-CRT.  2. Smoking: He is cutting back, needs to quit.  He is taking Wellbutrin.  Encouraged to quit.  3. OSA: Continue nightly CPAP.   [name] 12/29/2015

## 2016-01-07 ENCOUNTER — Other Ambulatory Visit (HOSPITAL_COMMUNITY): Payer: Self-pay | Admitting: Internal Medicine

## 2016-02-25 ENCOUNTER — Other Ambulatory Visit (HOSPITAL_COMMUNITY): Payer: Self-pay | Admitting: *Deleted

## 2016-02-25 MED ORDER — SACUBITRIL-VALSARTAN 24-26 MG PO TABS: 1.0000 | ORAL_TABLET | Freq: Two times a day (BID) | ORAL | 11 refills | Status: DC

## 2016-02-25 MED ORDER — FUROSEMIDE 40 MG PO TABS: 40.0000 mg | ORAL_TABLET | Freq: Every day | ORAL | 3 refills | Status: DC

## 2016-02-25 NOTE — Telephone Encounter (Signed)
Pt called to get a refill of his Lasix and Entresto. Refill sent in to [facility].

## 2016-03-08 ENCOUNTER — Encounter: Payer: Self-pay | Admitting: Internal Medicine

## 2016-03-14 ENCOUNTER — Other Ambulatory Visit (HOSPITAL_COMMUNITY): Payer: Self-pay | Admitting: Cardiology

## 2016-03-22 ENCOUNTER — Ambulatory Visit (INDEPENDENT_AMBULATORY_CARE_PROVIDER_SITE_OTHER): Payer: 59 | Admitting: Internal Medicine

## 2016-03-22 ENCOUNTER — Encounter (INDEPENDENT_AMBULATORY_CARE_PROVIDER_SITE_OTHER): Payer: Self-pay

## 2016-03-22 ENCOUNTER — Encounter: Payer: Self-pay | Admitting: Internal Medicine

## 2016-03-22 ENCOUNTER — Ambulatory Visit
Admission: RE | Admit: 2016-03-22 | Discharge: 2016-03-22 | Disposition: A | Discharge status: Home / Self Care | Payer: 59 | Source: Ambulatory Visit | Attending: Internal Medicine | Admitting: Internal Medicine

## 2016-03-22 VITALS — BP 118/64 | HR 80 | Ht 79.0 in | Wt 318.6 lb

## 2016-03-22 DIAGNOSIS — I5022 Chronic systolic (congestive) heart failure: Secondary | ICD-10-CM

## 2016-03-22 DIAGNOSIS — I447 Left bundle-branch block, unspecified: Secondary | ICD-10-CM

## 2016-03-22 DIAGNOSIS — Z95810 Presence of automatic (implantable) cardiac defibrillator: Secondary | ICD-10-CM | POA: Diagnosis not present

## 2016-03-22 DIAGNOSIS — I429 Cardiomyopathy, unspecified: Secondary | ICD-10-CM

## 2016-03-22 DIAGNOSIS — I428 Other cardiomyopathies: Secondary | ICD-10-CM

## 2016-03-22 NOTE — Progress Notes (Signed)
Patient Care Team: Lianne Moris, PA-C as PCP - General (Family Medicine)   HPI  Ethan York is a 40 y.o. male Seen in followup for CRT-D implanted 5/17 for NICM LBBB and CHF  He is modestly improved post CRT-D implantation. He's had no edema. Left  Records and Results Reviewed   Past Medical History:  Diagnosis Date  . Chronic systolic CHF (congestive heart failure) (HCC) 07/04/2015  . LBBB (left bundle branch block) 12/11/2015  . NICM (nonischemic cardiomyopathy) (HCC) 12/11/2015    Past Surgical History:  Procedure Laterality Date  . CARDIAC CATHETERIZATION N/A 06/18/2015   Procedure: Right/Left Heart Cath and Coronary Angiography;  Surgeon: Laurey Morale, MD;  Location: [facility] INVASIVE CV LAB;  Service: Cardiovascular;  Laterality: N/A;  . EP IMPLANTABLE DEVICE N/A 12/11/2015   Procedure: BiV ICD Insertion CRT-D;  Surgeon: Duke Salvia, MD;  Location: [facility] INVASIVE CV LAB;  Service: Cardiovascular;  Laterality: N/A;    Current Outpatient Prescriptions  Medication Sig Dispense Refill  . acetaminophen (TYLENOL) 325 MG tablet Take 1-2 tablets (325-650 mg total) by mouth every 4 (four) hours as needed for mild pain.    . Aspirin-Acetaminophen (GOODY BODY PAIN) 500-325 MG PACK Take 1 packet by mouth daily as needed (pain).     Marland Kitchen buPROPion (WELLBUTRIN SR) 150 MG 12 hr tablet TAKE 1 TAB (150 mg) TWICE DAILY 60 tablet 3  . carvedilol (COREG) 12.5 MG tablet Take 1.5 tablets (18.75 mg total) by mouth 2 (two) times daily with a meal. 90 tablet 3  . furosemide (LASIX) 40 MG tablet Take 1 tablet (40 mg total) by mouth daily. HOLD IF WEIGHT IS LESS THAN 295 60 tablet 3  . multivitamin (ONE-A-DAY MEN'S) TABS tablet Take 1 tablet by mouth daily.    . sacubitril-valsartan (ENTRESTO) 24-26 MG Take 1 tablet by mouth 2 (two) times daily. 60 tablet 11  . spironolactone (ALDACTONE) 25 MG tablet TAKE 1 TABLET (25 MG TOTAL) BY MOUTH DAILY. 30 tablet 3   No current facility-administered  medications for this visit.     No Known Allergies    Review of Systems negative except from HPI and PMH  Physical Exam BP 118/64   Pulse 80   Ht 6\' 7"  (2.007 m)   Wt (!) 318 lb 9.6 oz (144.5 kg)   SpO2 98%   BMI 35.89 kg/m  Well developed and well nourished in no acute distress HENT normal E scleral and icterus clear Neck Supple JVP flat; carotids brisk and full Device pocket well healed; without hematoma or erythema.  There is no tethering  Clear to ausculation  Regular rate and rhythm, no murmurs gallops or rub Soft with active bowel sounds No clubbing cyanosis  Edema Alert and oriented, grossly normal motor and sensory function Skin Warm and Dry    <ECG>  P-synchronous/ AV  pacing with predominantly neg QRS V1 and positive 1   Assessment and Plan:  Nonischemic cardiomyopathy  Congestive heart failure-class II  Left bundle branch block  Morbidly obese  CRT-D  ,St Jude   The patient's pacing configuration is more left bundle. Efforts to reprogram using various pacing configurations and LV offsets failed to recapitulate the negative QRS in lead 1 or the upright QRS in lead V1 that were noted post implantation. We will use one as it was the most distal electrode on his 2 week post implant chest x-ray. We will recheck his chest x-ray.     Current medicines  are reviewed at length with the patient today .  The patient does not  have concerns regarding medicines.

## 2016-03-22 NOTE — Patient Instructions (Signed)
Medication Instructions: - Your physician recommends that you continue on your current medications as directed. Please refer to the Current Medication list given to you today.  Labwork: - none  Procedures/Testing: - A chest x-ray takes a picture of the organs and structures inside the chest, including the heart, lungs, and blood vessels. This test can show several things, including, whether the heart is enlarges; whether fluid is building up in the lungs; and whether pacemaker / defibrillator leads are still in place.  [facility]: - turn left on to [address] - at the intersection of [address] & [employer] [address]- turn right - [facility] will be on your right ([address] - [facility] is on the 1st floor.  Follow-Up: - Remote monitoring is used to monitor your Pacemaker of ICD from home. This monitoring reduces the number of office visits required to check your device to one time per year. It allows [place] to keep an eye on the functioning of your device to ensure it is working properly. You are scheduled for a device check from home on 06/21/16. You may send your transmission at any time that day. If you have a wireless device, the transmission will be sent automatically. After your physician reviews your transmission, you will receive a postcard with your next transmission date.  - Your physician wants you to follow-up in: 9 months with Dr. Graciela Husbands. You will receive a reminder letter in the mail two months in advance. If you don't receive a letter, please call our office to schedule the follow-up appointment.  Any Additional Special Instructions Will Be Listed Below (If Applicable).     If you need a refill on your cardiac medications before your next appointment, please call your pharmacy.

## 2016-03-29 ENCOUNTER — Telehealth: Payer: Self-pay

## 2016-03-29 NOTE — Telephone Encounter (Signed)
lmtcb for results. 

## 2016-03-30 ENCOUNTER — Telehealth: Payer: Self-pay | Admitting: Internal Medicine

## 2016-03-30 NOTE — Telephone Encounter (Signed)
New message  Pt verbalized that he is calling for his results of his labs

## 2016-03-30 NOTE — Telephone Encounter (Signed)
Discussed chest x-ray results with the patient.

## 2016-04-01 LAB — CUP PACEART INCLINIC DEVICE CHECK
Battery Remaining Longevity: 56.4
Brady Statistic RV Percent Paced: 99.95 %
HighPow Impedance: 88.875
Implantable Lead Implant Date: 20170512
Implantable Lead Location: 753860
Lead Channel Impedance Value: 712.5 Ohm
Lead Channel Pacing Threshold Amplitude: 1 V
Lead Channel Pacing Threshold Amplitude: 2.5 V
Lead Channel Pacing Threshold Pulse Width: 0.5 ms
Lead Channel Pacing Threshold Pulse Width: 0.5 ms
Lead Channel Pacing Threshold Pulse Width: 1 ms
Lead Channel Pacing Threshold Pulse Width: 1 ms
Lead Channel Sensing Intrinsic Amplitude: 5 mV
Lead Channel Setting Pacing Amplitude: 2 V
Lead Channel Setting Pacing Pulse Width: 0.5 ms
Lead Channel Setting Pacing Pulse Width: 1 ms
MDC IDC LEAD IMPLANT DT: 20170512
MDC IDC LEAD IMPLANT DT: 20170512
MDC IDC LEAD LOCATION: 753858
MDC IDC LEAD LOCATION: 753859
MDC IDC LEAD MODEL: 7122
MDC IDC MSMT LEADCHNL LV IMPEDANCE VALUE: 637.5 Ohm
MDC IDC MSMT LEADCHNL LV PACING THRESHOLD AMPLITUDE: 2.5 V
MDC IDC MSMT LEADCHNL RA PACING THRESHOLD AMPLITUDE: 0.75 V
MDC IDC MSMT LEADCHNL RV IMPEDANCE VALUE: 625 Ohm
MDC IDC MSMT LEADCHNL RV SENSING INTR AMPL: 12 mV
MDC IDC PG SERIAL: 7355027
MDC IDC SESS DTM: 20170822181915
MDC IDC SET LEADCHNL LV PACING AMPLITUDE: 3 V
MDC IDC SET LEADCHNL RA PACING AMPLITUDE: 3.5 V
MDC IDC SET LEADCHNL RV SENSING SENSITIVITY: 0.5 mV
MDC IDC STAT BRADY RA PERCENT PACED: 16 %

## 2016-04-06 ENCOUNTER — Other Ambulatory Visit (HOSPITAL_COMMUNITY): Payer: Self-pay | Admitting: Cardiology

## 2016-04-06 DIAGNOSIS — I5022 Chronic systolic (congestive) heart failure: Secondary | ICD-10-CM

## 2016-04-09 ENCOUNTER — Other Ambulatory Visit: Payer: Self-pay | Admitting: Cardiology

## 2016-06-02 ENCOUNTER — Other Ambulatory Visit (HOSPITAL_COMMUNITY): Payer: Self-pay | Admitting: Internal Medicine

## 2016-06-18 ENCOUNTER — Other Ambulatory Visit: Payer: Self-pay | Admitting: Cardiology

## 2016-06-21 ENCOUNTER — Telehealth: Payer: Self-pay | Admitting: Cardiology

## 2016-06-21 ENCOUNTER — Encounter: Payer: 59 | Admitting: *Deleted

## 2016-06-21 NOTE — Telephone Encounter (Signed)
LMOVM reminding pt to send remote transmission.   

## 2016-06-22 ENCOUNTER — Encounter: Payer: Self-pay | Admitting: Cardiology

## 2016-07-07 ENCOUNTER — Ambulatory Visit (INDEPENDENT_AMBULATORY_CARE_PROVIDER_SITE_OTHER): Payer: 59 | Admitting: *Deleted

## 2016-07-07 DIAGNOSIS — I428 Other cardiomyopathies: Secondary | ICD-10-CM | POA: Diagnosis not present

## 2016-07-12 NOTE — Progress Notes (Signed)
Remote ICD transmission.   

## 2016-07-13 ENCOUNTER — Encounter: Payer: Self-pay | Admitting: Cardiology

## 2016-07-19 ENCOUNTER — Other Ambulatory Visit (HOSPITAL_COMMUNITY): Payer: Self-pay | Admitting: Cardiology

## 2016-07-19 DIAGNOSIS — I5022 Chronic systolic (congestive) heart failure: Secondary | ICD-10-CM

## 2016-08-03 LAB — CUP PACEART REMOTE DEVICE CHECK
Brady Statistic AP VS Percent: 1 %
Brady Statistic AS VP Percent: 89 %
Date Time Interrogation Session: 20171208022146
HighPow Impedance: 74 Ohm
HighPow Impedance: 74 Ohm
Implantable Lead Implant Date: 20170512
Implantable Lead Implant Date: 20170512
Implantable Lead Implant Date: 20170512
Implantable Lead Location: 753859
Lead Channel Impedance Value: 630 Ohm
Lead Channel Pacing Threshold Amplitude: 0.75 V
Lead Channel Setting Pacing Amplitude: 3.5 V
Lead Channel Setting Pacing Pulse Width: 0.5 ms
Lead Channel Setting Pacing Pulse Width: 1 ms
MDC IDC LEAD LOCATION: 753858
MDC IDC LEAD LOCATION: 753860
MDC IDC LEAD MODEL: 7122
MDC IDC MSMT BATTERY REMAINING LONGEVITY: 62 mo
MDC IDC MSMT BATTERY REMAINING PERCENTAGE: 89 %
MDC IDC MSMT BATTERY VOLTAGE: 3.07 V
MDC IDC MSMT LEADCHNL LV IMPEDANCE VALUE: 1025 Ohm
MDC IDC MSMT LEADCHNL LV PACING THRESHOLD AMPLITUDE: 2 V
MDC IDC MSMT LEADCHNL LV PACING THRESHOLD PULSEWIDTH: 1 ms
MDC IDC MSMT LEADCHNL RA PACING THRESHOLD PULSEWIDTH: 0.5 ms
MDC IDC MSMT LEADCHNL RA SENSING INTR AMPL: 5 mV
MDC IDC MSMT LEADCHNL RV IMPEDANCE VALUE: 550 Ohm
MDC IDC MSMT LEADCHNL RV PACING THRESHOLD AMPLITUDE: 0.875 V
MDC IDC MSMT LEADCHNL RV PACING THRESHOLD PULSEWIDTH: 0.5 ms
MDC IDC MSMT LEADCHNL RV SENSING INTR AMPL: 12 mV
MDC IDC PG IMPLANT DT: 20170512
MDC IDC SET LEADCHNL LV PACING AMPLITUDE: 3 V
MDC IDC SET LEADCHNL RV PACING AMPLITUDE: 2 V
MDC IDC SET LEADCHNL RV SENSING SENSITIVITY: 0.5 mV
MDC IDC STAT BRADY AP VP PERCENT: 11 %
MDC IDC STAT BRADY AS VS PERCENT: 1 %
MDC IDC STAT BRADY RA PERCENT PACED: 11 %
Pulse Gen Serial Number: 7355027

## 2016-09-22 ENCOUNTER — Other Ambulatory Visit (HOSPITAL_COMMUNITY): Payer: Self-pay | Admitting: Internal Medicine

## 2016-09-22 MED ORDER — FUROSEMIDE 40 MG PO TABS: 40.0000 mg | ORAL_TABLET | Freq: Every day | ORAL | 3 refills | Status: DC

## 2016-09-28 ENCOUNTER — Other Ambulatory Visit: Payer: Self-pay | Admitting: Cardiology

## 2016-10-06 ENCOUNTER — Telehealth: Payer: Self-pay | Admitting: Cardiology

## 2016-10-06 ENCOUNTER — Ambulatory Visit (INDEPENDENT_AMBULATORY_CARE_PROVIDER_SITE_OTHER): Payer: 59 | Admitting: *Deleted

## 2016-10-06 DIAGNOSIS — I428 Other cardiomyopathies: Secondary | ICD-10-CM | POA: Diagnosis not present

## 2016-10-06 NOTE — Telephone Encounter (Signed)
LMOVM reminding pt to send remote transmission.   

## 2016-10-07 ENCOUNTER — Encounter: Payer: Self-pay | Admitting: Cardiology

## 2016-10-07 LAB — CUP PACEART REMOTE DEVICE CHECK
Battery Remaining Longevity: 59 mo
Brady Statistic AP VP Percent: 9.1 %
Brady Statistic AP VS Percent: 1 %
Brady Statistic AS VP Percent: 91 %
Brady Statistic AS VS Percent: 1 %
Date Time Interrogation Session: 20180309022252
HIGH POWER IMPEDANCE MEASURED VALUE: 79 Ohm
HighPow Impedance: 79 Ohm
Implantable Lead Implant Date: 20170512
Implantable Lead Implant Date: 20170512
Lead Channel Impedance Value: 700 Ohm
Lead Channel Pacing Threshold Pulse Width: 0.5 ms
Lead Channel Pacing Threshold Pulse Width: 1 ms
Lead Channel Sensing Intrinsic Amplitude: 5 mV
Lead Channel Setting Pacing Amplitude: 2 V
Lead Channel Setting Pacing Amplitude: 3 V
Lead Channel Setting Pacing Pulse Width: 0.5 ms
Lead Channel Setting Sensing Sensitivity: 0.5 mV
MDC IDC LEAD IMPLANT DT: 20170512
MDC IDC LEAD LOCATION: 753858
MDC IDC LEAD LOCATION: 753859
MDC IDC LEAD LOCATION: 753860
MDC IDC MSMT BATTERY REMAINING PERCENTAGE: 85 %
MDC IDC MSMT BATTERY VOLTAGE: 2.99 V
MDC IDC MSMT LEADCHNL LV IMPEDANCE VALUE: 1000 Ohm
MDC IDC MSMT LEADCHNL LV PACING THRESHOLD AMPLITUDE: 2 V
MDC IDC MSMT LEADCHNL RA PACING THRESHOLD AMPLITUDE: 0.75 V
MDC IDC MSMT LEADCHNL RA PACING THRESHOLD PULSEWIDTH: 0.5 ms
MDC IDC MSMT LEADCHNL RV IMPEDANCE VALUE: 530 Ohm
MDC IDC MSMT LEADCHNL RV PACING THRESHOLD AMPLITUDE: 0.875 V
MDC IDC MSMT LEADCHNL RV SENSING INTR AMPL: 12 mV
MDC IDC PG IMPLANT DT: 20170512
MDC IDC SET LEADCHNL LV PACING PULSEWIDTH: 1 ms
MDC IDC SET LEADCHNL RA PACING AMPLITUDE: 3.5 V
MDC IDC STAT BRADY RA PERCENT PACED: 8.9 %
Pulse Gen Serial Number: 7355027

## 2016-10-07 NOTE — Progress Notes (Signed)
Remote ICD transmission.   

## 2016-10-08 ENCOUNTER — Other Ambulatory Visit: Payer: Self-pay | Admitting: Cardiology

## 2016-10-08 ENCOUNTER — Other Ambulatory Visit (HOSPITAL_COMMUNITY): Payer: Self-pay | Admitting: Internal Medicine

## 2016-11-28 ENCOUNTER — Telehealth (HOSPITAL_COMMUNITY): Payer: Self-pay | Admitting: Pharmacist

## 2016-11-28 NOTE — Telephone Encounter (Signed)
Entresto 24-26 mg BID PA approved by [employer] through 11/28/17.   Tyler Deis. Bonnye Fava, PharmD, BCPS, CPP Clinical Pharmacist Pager: [phone] Phone: [phone] 11/28/2016 2:50 PM

## 2016-12-05 ENCOUNTER — Other Ambulatory Visit (HOSPITAL_COMMUNITY): Payer: Self-pay | Admitting: Cardiology

## 2016-12-05 DIAGNOSIS — I5022 Chronic systolic (congestive) heart failure: Secondary | ICD-10-CM

## 2016-12-05 MED ORDER — CARVEDILOL 12.5 MG PO TABS: 18.7500 mg | ORAL_TABLET | Freq: Two times a day (BID) | ORAL | 2 refills | Status: DC

## 2017-03-15 ENCOUNTER — Encounter: Payer: 59 | Admitting: Internal Medicine

## 2017-04-04 ENCOUNTER — Ambulatory Visit (INDEPENDENT_AMBULATORY_CARE_PROVIDER_SITE_OTHER): Payer: 59 | Admitting: Internal Medicine

## 2017-04-04 ENCOUNTER — Encounter: Payer: Self-pay | Admitting: Internal Medicine

## 2017-04-04 VITALS — BP 126/68 | HR 108 | Ht 79.0 in | Wt 359.0 lb

## 2017-04-04 DIAGNOSIS — I428 Other cardiomyopathies: Secondary | ICD-10-CM | POA: Diagnosis not present

## 2017-04-04 DIAGNOSIS — I447 Left bundle-branch block, unspecified: Secondary | ICD-10-CM

## 2017-04-04 DIAGNOSIS — I5022 Chronic systolic (congestive) heart failure: Secondary | ICD-10-CM

## 2017-04-04 DIAGNOSIS — Z95810 Presence of automatic (implantable) cardiac defibrillator: Secondary | ICD-10-CM

## 2017-04-04 MED ORDER — CARVEDILOL 25 MG PO TABS: 25.0000 mg | ORAL_TABLET | Freq: Two times a day (BID) | ORAL | 3 refills | Status: DC

## 2017-04-04 NOTE — Progress Notes (Signed)
Patient Care Team: Lianne Moris, PA-C as PCP - General (Family Medicine)   HPI  Ethan York is a 41 y.o. male Seen in followup for CRT-D implanted 5/17 for NICM LBBB and CHF  He has less shortness of breath; no edema  Poorly compliant Rx for OSA  No chest pain   DATE TEST    11/16    Echo   EF 20-25 %   3/17    Echo   EF 25 %         Stopped smoking, but started putting on weight, so started smoking again  Records and Results Reviewed   Past Medical History:  Diagnosis Date  . Chronic systolic CHF (congestive heart failure) (HCC) 07/04/2015  . LBBB (left bundle branch block) 12/11/2015  . NICM (nonischemic cardiomyopathy) (HCC) 12/11/2015    Past Surgical History:  Procedure Laterality Date  . CARDIAC CATHETERIZATION N/A 06/18/2015   Procedure: Right/Left Heart Cath and Coronary Angiography;  Surgeon: Laurey Morale, MD;  Location[facility] INVASIVE CV LAB;  Service: Cardiovascular;  Laterality: N/A;  . EP IMPLANTABLE DEVICE N/A 12/11/2015   Procedure: BiV ICD Insertion CRT-D;  Surgeon: Duke Salvia, MD;  Location: [facility] INVASIVE CV LAB;  Service: Cardiovascular;  Laterality: N/A;    Current Outpatient Prescriptions  Medication Sig Dispense Refill  . acetaminophen (TYLENOL) 325 MG tablet Take 1-2 tablets (325-650 mg total) by mouth every 4 (four) hours as needed for mild pain.    . Aspirin-Acetaminophen (GOODY BODY PAIN) 500-325 MG PACK Take 1 packet by mouth daily as needed (pain).     Marland Kitchen buPROPion (WELLBUTRIN SR) 150 MG 12 hr tablet TAKE 1 TAB (150 MG) TWICE DAILY 60 tablet 3  . carvedilol (COREG) 12.5 MG tablet Take 1.5 tablets (18.75 mg total) by mouth 2 (two) times daily with a meal. 270 tablet 2  . ENTRESTO 24-26 MG TAKE 1 TABLET BY MOUTH TWO TIMES DAILY 60 tablet 11  . furosemide (LASIX) 40 MG tablet Take 1 tablet (40 mg total) by mouth daily. HOLD IF WEIGHT IS LESS THAN 295. NEEDS OFFICE VISIT 90 tablet 3  . multivitamin (ONE-A-DAY MEN'S) TABS tablet Take 1  tablet by mouth daily.    Marland Kitchen spironolactone (ALDACTONE) 25 MG tablet Take 1 tablet (25 mg total) by mouth daily. NEEDS OFFICE VISIT 90 tablet 3   No current facility-administered medications for this visit.     No Known Allergies    Review of Systems negative except from HPI and PMH  Physical Exam BP 126/68   Pulse (!) 108   Ht 6\' 7"  (2.007 m)   Wt (!) 359 lb (162.8 kg)   BMI 40.44 kg/m  Well developed and nourished in no acute distress HENT normal Neck supple with JVP-flat Carotids brisk and full without bruits Device pocket well healed; without hematoma or erythema.  There is no tethering  Clear Regular rate and rhythm, no murmurs or gallops Abd-soft with active BS without hepatomegaly No Clubbing cyanosis edema Skin-warm and dry A & Oriented  Grossly normal sensory and motor function   ECG sinus 108 QRS uprigh 1 and neg V1  19/13/387  Assessment and Plan:  Nonischemic cardiomyopathy  Congestive heart failure-class II  Left bundle branch block  Morbidly obese  CRT-D  St Jude  Sinus tachycardia   The patient's pacing configuration is more left bundle. Efforts to reprogram using various pacing configurations and LV offsets failed to recapitulate the negative QRS in lead 1  or the upright QRS in lead V1 that were noted post implantation. We will use one as it was the most distal electrode on his 2 week post implant chest x-ray.   Will increase carvedilol 18.75>>25 bid Check TSH and CBC for sinus tach Check BMET/Mg on Guideline directed medical therapy   Check echo post CRT   Will arrange followup in CHF clinic  May benefit from corlanor      Current medicines are reviewed at length with the patient today .  The patient does not  have concerns regarding medicines.

## 2017-04-04 NOTE — Patient Instructions (Addendum)
Medication Instructions: - Your physician has recommended you make the following change in your medication:  1) Increase coreg (carvedilol) to 25 mg- take 1 tablet by mouth twice daily  - you may take the 12.5 mg tablets of carvedilol until you use them up- just take 2 tablets (25 mg) twice daily  Labwork: - Your physician recommends that you have lab work today: BMP/ CBC/ TSH  Procedures/Testing: - none ordered  Follow-Up: - Your physician recommends that you schedule a follow-up appointment in: 3 months with the CHF clinic  - Remote monitoring is used to monitor your Pacemaker of ICD from home. This monitoring reduces the number of office visits required to check your device to one time per year. It allows [place] to keep an eye on the functioning of your device to ensure it is working properly. You are scheduled for a device check from home on 07/04/17. You may send your transmission at any time that day. If you have a wireless device, the transmission will be sent automatically. After your physician reviews your transmission, you will receive a postcard with your next transmission date.   - Your physician wants you to follow-up in: 1 year with Dr. Graciela Husbands.  You will receive a reminder letter in the mail two months in advance. If you don't receive a letter, please call our office to schedule the follow-up appointment.  Any Additional Special Instructions Will Be Listed Below (If Applicable).     If you need a refill on your cardiac medications before your next appointment, please call your pharmacy.

## 2017-04-05 LAB — CBC WITH DIFFERENTIAL/PLATELET
BASOS: 0 %
Basophils Absolute: 0 10*3/uL (ref 0.0–0.2)
EOS (ABSOLUTE): 0.2 10*3/uL (ref 0.0–0.4)
EOS: 2 %
Hematocrit: 45.1 % (ref 37.5–51.0)
Hemoglobin: 15.4 g/dL (ref 13.0–17.7)
IMMATURE GRANS (ABS): 0 10*3/uL (ref 0.0–0.1)
IMMATURE GRANULOCYTES: 0 %
Lymphocytes Absolute: 3.1 10*3/uL (ref 0.7–3.1)
Lymphs: 24 %
MCH: 31.5 pg (ref 26.6–33.0)
MCHC: 34.1 g/dL (ref 31.5–35.7)
MCV: 92 fL (ref 79–97)
MONOS ABS: 0.7 10*3/uL (ref 0.1–0.9)
Monocytes: 6 %
NEUTROS PCT: 68 %
Neutrophils Absolute: 8.8 10*3/uL — ABNORMAL HIGH (ref 1.4–7.0)
PLATELETS: 313 10*3/uL (ref 150–379)
RBC: 4.89 x10E6/uL (ref 4.14–5.80)
RDW: 13.7 % (ref 12.3–15.4)
WBC: 12.9 10*3/uL — ABNORMAL HIGH (ref 3.4–10.8)

## 2017-04-05 LAB — BASIC METABOLIC PANEL
BUN / CREAT RATIO: 11 (ref 9–20)
BUN: 11 mg/dL (ref 6–24)
CHLORIDE: 102 mmol/L (ref 96–106)
CO2: 22 mmol/L (ref 20–29)
Calcium: 9.5 mg/dL (ref 8.7–10.2)
Creatinine, Ser: 1.03 mg/dL (ref 0.76–1.27)
GFR, EST AFRICAN AMERICAN: 104 mL/min/{1.73_m2} (ref >59)
GFR, EST NON AFRICAN AMERICAN: 90 mL/min/{1.73_m2} (ref >59)
Glucose: 106 mg/dL — ABNORMAL HIGH (ref 65–99)
POTASSIUM: 3.8 mmol/L (ref 3.5–5.2)
Sodium: 140 mmol/L (ref 134–144)

## 2017-04-05 LAB — TSH: TSH: 1.29 u[IU]/mL (ref 0.450–4.500)

## 2017-04-11 ENCOUNTER — Telehealth: Payer: Self-pay

## 2017-04-11 NOTE — Telephone Encounter (Signed)
Pt is aware and agreeable to normal results  

## 2017-04-24 LAB — CUP PACEART INCLINIC DEVICE CHECK
Battery Remaining Longevity: 55 mo
Brady Statistic RA Percent Paced: 8.9 %
Brady Statistic RV Percent Paced: 99.97 %
Date Time Interrogation Session: 20180904190040
HIGH POWER IMPEDANCE MEASURED VALUE: 96.75 Ohm
Implantable Lead Location: 753858
Implantable Lead Location: 753860
Lead Channel Impedance Value: 525 Ohm
Lead Channel Impedance Value: 662.5 Ohm
Lead Channel Pacing Threshold Amplitude: 0.75 V
Lead Channel Pacing Threshold Amplitude: 1 V
Lead Channel Pacing Threshold Amplitude: 1.75 V
Lead Channel Pacing Threshold Pulse Width: 0.5 ms
Lead Channel Pacing Threshold Pulse Width: 0.5 ms
Lead Channel Setting Pacing Amplitude: 2 V
Lead Channel Setting Pacing Amplitude: 2 V
Lead Channel Setting Pacing Pulse Width: 1 ms
Lead Channel Setting Sensing Sensitivity: 0.5 mV
MDC IDC LEAD IMPLANT DT: 20170512
MDC IDC LEAD IMPLANT DT: 20170512
MDC IDC LEAD IMPLANT DT: 20170512
MDC IDC LEAD LOCATION: 753859
MDC IDC MSMT LEADCHNL LV IMPEDANCE VALUE: 1200 Ohm
MDC IDC MSMT LEADCHNL LV PACING THRESHOLD PULSEWIDTH: 1 ms
MDC IDC MSMT LEADCHNL RA SENSING INTR AMPL: 5 mV
MDC IDC MSMT LEADCHNL RV SENSING INTR AMPL: 12 mV
MDC IDC PG IMPLANT DT: 20170512
MDC IDC PG SERIAL: 7355027
MDC IDC SET LEADCHNL LV PACING AMPLITUDE: 3 V
MDC IDC SET LEADCHNL RV PACING PULSEWIDTH: 0.5 ms

## 2017-05-11 ENCOUNTER — Encounter (HOSPITAL_COMMUNITY): Payer: Self-pay | Admitting: Cardiology

## 2017-05-11 ENCOUNTER — Ambulatory Visit (HOSPITAL_BASED_OUTPATIENT_CLINIC_OR_DEPARTMENT_OTHER)
Admission: RE | Admit: 2017-05-11 | Discharge: 2017-05-11 | Disposition: A | Discharge status: Home / Self Care | Payer: 59 | Source: Ambulatory Visit | Attending: Cardiology | Admitting: Cardiology

## 2017-05-11 ENCOUNTER — Ambulatory Visit (HOSPITAL_COMMUNITY)
Admission: RE | Admit: 2017-05-11 | Discharge: 2017-05-11 | Disposition: A | Discharge status: Home / Self Care | Payer: 59 | Source: Ambulatory Visit | Attending: Family Medicine | Admitting: Family Medicine

## 2017-05-11 VITALS — BP 130/87 | HR 55 | Wt 366.2 lb

## 2017-05-11 DIAGNOSIS — I428 Other cardiomyopathies: Secondary | ICD-10-CM | POA: Insufficient documentation

## 2017-05-11 DIAGNOSIS — I5022 Chronic systolic (congestive) heart failure: Secondary | ICD-10-CM | POA: Insufficient documentation

## 2017-05-11 DIAGNOSIS — I447 Left bundle-branch block, unspecified: Secondary | ICD-10-CM

## 2017-05-11 MED ORDER — SACUBITRIL-VALSARTAN 49-51 MG PO TABS: 1.0000 | ORAL_TABLET | Freq: Two times a day (BID) | ORAL | 3 refills | Status: DC

## 2017-05-11 MED ORDER — PERFLUTREN LIPID MICROSPHERE
1.0000 mL | INTRAVENOUS | Status: AC | PRN
  Administered 2017-05-11: 9 mL via INTRAVENOUS
  Filled 2017-05-11: qty 10

## 2017-05-11 NOTE — Progress Notes (Signed)
Medication Samples have been provided to the patient.  Drug name: Sherryll Burger       Strength: 49/51        Qty: 2  LOT: [id number]  Exp.Date: 5/20  Dosing instructions: take 2 tabs, twice a day  The patient has been instructed regarding the correct time, dose, and frequency of taking this medication, including desired effects and most common side effects.   Teresa Coombs 12:03 PM 05/11/2017

## 2017-05-11 NOTE — Progress Notes (Signed)
22g PIV x 1 attempt started to [facility] for definity echocardiogram per Dr. Shirlee Latch VO. Echo tech to remove PIV once echo complete.  Ave Filter, RN

## 2017-05-11 NOTE — Progress Notes (Signed)
  Echocardiogram 2D Echocardiogram with definity has been performed.  Leta Jungling M 05/11/2017, 10:59 AM

## 2017-05-11 NOTE — Progress Notes (Signed)
Patient ID: Ethan York, male   DOB: Dec 29, 1975, 41 y.o.   MRN: [id number]  PCP: Dr. Tommi Rumps [facility]) Cardiology: Dr. Shirlee Latch  41 yo with history of HTN and OSA was admitted to [facility] in 11/16 with several weeks of progressive dyspnea and was found to have acute systolic CHF with EF 20-25% by echo.  No chest pain.  He was diuresed and had left and right heart cath, showing preserved cardiac output with elevated filling pressures and no significant CAD.  Echo 3/17 with persistently decreased EF, 25%.  CPX 4/17 with moderate functional limitation due to heart failure.  He had St Jude CRT-D placed in 5/17.   He returns today for followup of CHF.  Echo was done today, EF 30-35% with diffuse hypokinesis.  Symptomatically doing well.  Works full time as a Curator.  No significant exertional dyspnea.  No job limitation.  No chest pain.  No orthopnea/PND.  He quit smoking and gained about 50 lbs.  He has gone back to smoking a couple of cigarettes a day.  His diet is poor, typically fast food at least once a day.  He is using CPAP some but not always.   Labs (11/16): K 3.3, creatinine 1.19, LDL 84, BNP 884, TSH normal Labs (12/16): K 4.3, creatinine 1.06, BNP 123 Labs (07/2015): K 4.8 Creatinine 1.04  Labs (09/2015): Creatinine 1.26  Labs (5/17): K 4.1, creatinine 0.96 Labs (9/18): TSH normal, K 3.8, creatihine 1.03, hgb 15.4  PMH: 1. HTN 2. OSA: Using CPAP  3. GERD 4. Chronic systolic CHF: Nonischemic cardiomyopathy.   - Echo (11/16) with EF 20-25%, diffuse hypokinesis, mild-moderate MR, PASP 35 mmHg, severe LAE.  - LHC/RHC (11/16) with no significant CAD; mean RA 9, PA 50/29 mean 37, mean PCWP 22, CI 2.47, PVR 2.13, LVEDP 30.  - Unable to fit for cardiac MRI. - Echo (3/17) with EF 25%, septal-lateral dyssynchrony, mild to moderately dilated LV, normal RV size and systolic function.  - CPX (4/17): peak VO2 16.6 (54% predicted), VE/VCO2 slope 25, RER 1.13 => moderate HF limitation.  - 5/17 St  Jude CRT-D placed.  - Echo (10/18): EF 30-35%, diffuse hypokinesis, normal RV size and systolic function.  5. LBBB 6. Active smoker  FH: - Brother with MI at 11 - Mother with MI in her 57s - No FH of cardiomyopathy.   SH: Married, lives in [place], Games developer.  He smoked 2.5 ppd, now down to 2-3 cigs/day.  Occasional ETOH.   ROS: All systems reviewed and negative except as per HPI.   Current Outpatient Prescriptions  Medication Sig Dispense Refill  . acetaminophen (TYLENOL) 325 MG tablet Take 1-2 tablets (325-650 mg total) by mouth every 4 (four) hours as needed for mild pain.    . Aspirin-Acetaminophen (GOODY BODY PAIN) 500-325 MG PACK Take 1 packet by mouth daily as needed (pain).     Marland Kitchen buPROPion (WELLBUTRIN SR) 150 MG 12 hr tablet TAKE 1 TAB (150 MG) TWICE DAILY 60 tablet 3  . carvedilol (COREG) 25 MG tablet Take 1 tablet (25 mg total) by mouth 2 (two) times daily. 180 tablet 3  . furosemide (LASIX) 40 MG tablet Take 1 tablet (40 mg total) by mouth daily. HOLD IF WEIGHT IS LESS THAN 295. NEEDS OFFICE VISIT 90 tablet 3  . multivitamin (ONE-A-DAY MEN'S) TABS tablet Take 1 tablet by mouth daily.    Marland Kitchen spironolactone (ALDACTONE) 25 MG tablet Take 1 tablet (25 mg total) by mouth daily. NEEDS OFFICE  VISIT 90 tablet 3  . sacubitril-valsartan (ENTRESTO) 49-51 MG Take 1 tablet by mouth 2 (two) times daily. 60 tablet 3   No current facility-administered medications for this encounter.    BP 130/87 (BP Location: Right Arm, Patient Position: Sitting, Cuff Size: Large)   Pulse (!) 55   Wt (!) 366 lb 4 oz (166.1 kg)   SpO2 98%   BMI 41.26 kg/m  General: NAD, obese Neck: No JVD, no thyromegaly or thyroid nodule.  Lungs: Clear to auscultation bilaterally with normal respiratory effort. CV: Nondisplaced PMI.  Heart regular S1/S2, no S3/S4, no murmur.  1+ ankle edema bilaterally with bilateral lower leg varices.  No carotid bruit.  Normal pedal pulses.  Abdomen: Soft, nontender, no  hepatosplenomegaly, no distention.  Skin: Intact without lesions or rashes.  Neurologic: Alert and oriented x 3.  Psych: Normal affect. Extremities: No clubbing or cyanosis.  HEENT: Normal.   Assessment/Plan: 1. Chronic systolic CHF: Nonischemic cardiomyopathy.  EF 20-25% on 11/16 echo, repeat echo in 3/17 with EF 25%, septal-lateral dyssynchrony.  Unable to fit in MRI.  Cause of cardiomyopathy still uncertain.  No significant CAD on coronary angiography.  Has family history of CAD but not cardiomyopathy.  Possible viral myocarditis, also consider LBBB cardiomyopathy. He now has Secondary school teacher CRT-D device.  I reviewed today's echo, EF mildly increased to 30-35%.  NYHA class I-II symptoms.  He is not volume overloaded on exam.  - Increase Entresto to 49/51 bid.  BMET in 10 days.  - Volume status stable. Continue Lasix to 40 mg daily.  - Continue Coreg 25 mg bid and spironolactone 25 daily.     2. Smoking: Working on quitting.  3. OSA: Continue nightly CPAP.  4. Obesity: Weight is up a lot, I do not think that this is due to CHF.  We discussed diet/exercise and weight loss strategies.   Followup in 3 months.  [name] 05/11/2017

## 2017-05-11 NOTE — Patient Instructions (Addendum)
Increase Entresto 49/51 mg (1 tab) daily (samples given)   Your physician recommends that you return for lab work in: 10 days  Your physician recommends that you schedule a follow-up appointment in: 3 months

## 2017-05-23 ENCOUNTER — Other Ambulatory Visit: Payer: Self-pay | Admitting: Internal Medicine

## 2017-07-04 ENCOUNTER — Telehealth: Payer: Self-pay | Admitting: Cardiology

## 2017-07-04 ENCOUNTER — Encounter: Payer: 59 | Admitting: *Deleted

## 2017-07-04 NOTE — Telephone Encounter (Signed)
LMOVM reminding pt to send remote transmission.   

## 2017-07-06 ENCOUNTER — Encounter: Payer: Self-pay | Admitting: Cardiology

## 2017-07-12 ENCOUNTER — Ambulatory Visit (INDEPENDENT_AMBULATORY_CARE_PROVIDER_SITE_OTHER): Payer: 59 | Admitting: *Deleted

## 2017-07-12 DIAGNOSIS — I428 Other cardiomyopathies: Secondary | ICD-10-CM | POA: Diagnosis not present

## 2017-07-14 NOTE — Progress Notes (Signed)
Remote ICD transmission.   

## 2017-07-18 ENCOUNTER — Encounter: Payer: Self-pay | Admitting: Cardiology

## 2017-08-03 LAB — CUP PACEART REMOTE DEVICE CHECK
Battery Voltage: 2.96 V
Brady Statistic AP VP Percent: 9.3 %
Brady Statistic AP VS Percent: 1 %
Brady Statistic AS VP Percent: 91 %
Brady Statistic RA Percent Paced: 9.1 %
Date Time Interrogation Session: 20181213030119
HighPow Impedance: 75 Ohm
HighPow Impedance: 75 Ohm
Implantable Lead Implant Date: 20170512
Implantable Lead Location: 753858
Implantable Lead Location: 753859
Implantable Lead Location: 753860
Implantable Lead Model: 7122
Lead Channel Impedance Value: 1075 Ohm
Lead Channel Impedance Value: 660 Ohm
Lead Channel Pacing Threshold Amplitude: 0.75 V
Lead Channel Sensing Intrinsic Amplitude: 12 mV
Lead Channel Setting Pacing Amplitude: 2 V
Lead Channel Setting Pacing Pulse Width: 0.5 ms
Lead Channel Setting Pacing Pulse Width: 1 ms
MDC IDC LEAD IMPLANT DT: 20170512
MDC IDC LEAD IMPLANT DT: 20170512
MDC IDC MSMT BATTERY REMAINING LONGEVITY: 65 mo
MDC IDC MSMT BATTERY REMAINING PERCENTAGE: 75 %
MDC IDC MSMT LEADCHNL LV PACING THRESHOLD AMPLITUDE: 1.75 V
MDC IDC MSMT LEADCHNL LV PACING THRESHOLD PULSEWIDTH: 1 ms
MDC IDC MSMT LEADCHNL RA PACING THRESHOLD PULSEWIDTH: 0.5 ms
MDC IDC MSMT LEADCHNL RA SENSING INTR AMPL: 5 mV
MDC IDC MSMT LEADCHNL RV IMPEDANCE VALUE: 540 Ohm
MDC IDC MSMT LEADCHNL RV PACING THRESHOLD AMPLITUDE: 1 V
MDC IDC MSMT LEADCHNL RV PACING THRESHOLD PULSEWIDTH: 0.5 ms
MDC IDC PG IMPLANT DT: 20170512
MDC IDC PG SERIAL: 7355027
MDC IDC SET LEADCHNL LV PACING AMPLITUDE: 2.5 V
MDC IDC SET LEADCHNL RV PACING AMPLITUDE: 2 V
MDC IDC SET LEADCHNL RV SENSING SENSITIVITY: 0.5 mV
MDC IDC STAT BRADY AS VS PERCENT: 1 %

## 2017-09-21 ENCOUNTER — Other Ambulatory Visit (HOSPITAL_COMMUNITY): Payer: Self-pay | Admitting: Internal Medicine

## 2017-09-21 DIAGNOSIS — I5022 Chronic systolic (congestive) heart failure: Secondary | ICD-10-CM

## 2017-09-29 ENCOUNTER — Other Ambulatory Visit (HOSPITAL_COMMUNITY): Payer: Self-pay | Admitting: Cardiology

## 2017-10-11 ENCOUNTER — Encounter: Payer: 59 | Admitting: *Deleted

## 2017-10-11 ENCOUNTER — Telehealth: Payer: Self-pay | Admitting: Cardiology

## 2017-10-11 NOTE — Telephone Encounter (Signed)
LMOVM reminding pt to send remote transmission.   

## 2017-10-12 ENCOUNTER — Telehealth: Payer: Self-pay | Admitting: Cardiology

## 2017-10-12 ENCOUNTER — Encounter: Payer: Self-pay | Admitting: Cardiology

## 2017-10-12 ENCOUNTER — Ambulatory Visit (INDEPENDENT_AMBULATORY_CARE_PROVIDER_SITE_OTHER): Payer: 59 | Admitting: *Deleted

## 2017-10-12 DIAGNOSIS — I428 Other cardiomyopathies: Secondary | ICD-10-CM

## 2017-10-12 NOTE — Telephone Encounter (Signed)
Spoke with pt and reminded pt of remote transmission that is due today. Pt verbalized understanding.   

## 2017-10-13 ENCOUNTER — Encounter: Payer: Self-pay | Admitting: Cardiology

## 2017-10-13 NOTE — Progress Notes (Signed)
Remote ICD transmission.   

## 2017-10-20 ENCOUNTER — Other Ambulatory Visit (HOSPITAL_COMMUNITY): Payer: Self-pay | Admitting: Cardiology

## 2017-10-29 LAB — CUP PACEART REMOTE DEVICE CHECK
Battery Voltage: 2.96 V
Brady Statistic AP VS Percent: 1 %
Brady Statistic AS VP Percent: 93 %
Brady Statistic RA Percent Paced: 6.4 %
HighPow Impedance: 77 Ohm
HighPow Impedance: 77 Ohm
Implantable Lead Implant Date: 20170512
Implantable Lead Location: 753858
Implantable Lead Location: 753860
Implantable Lead Model: 7122
Implantable Pulse Generator Implant Date: 20170512
Lead Channel Impedance Value: 1125 Ohm
Lead Channel Impedance Value: 650 Ohm
Lead Channel Pacing Threshold Amplitude: 1 V
Lead Channel Pacing Threshold Amplitude: 1.75 V
Lead Channel Sensing Intrinsic Amplitude: 12 mV
Lead Channel Setting Pacing Amplitude: 2 V
Lead Channel Setting Pacing Pulse Width: 0.5 ms
Lead Channel Setting Pacing Pulse Width: 1 ms
Lead Channel Setting Sensing Sensitivity: 0.5 mV
MDC IDC LEAD IMPLANT DT: 20170512
MDC IDC LEAD IMPLANT DT: 20170512
MDC IDC LEAD LOCATION: 753859
MDC IDC MSMT BATTERY REMAINING LONGEVITY: 62 mo
MDC IDC MSMT BATTERY REMAINING PERCENTAGE: 72 %
MDC IDC MSMT LEADCHNL LV PACING THRESHOLD PULSEWIDTH: 1 ms
MDC IDC MSMT LEADCHNL RA PACING THRESHOLD AMPLITUDE: 0.75 V
MDC IDC MSMT LEADCHNL RA PACING THRESHOLD PULSEWIDTH: 0.5 ms
MDC IDC MSMT LEADCHNL RA SENSING INTR AMPL: 5 mV
MDC IDC MSMT LEADCHNL RV IMPEDANCE VALUE: 460 Ohm
MDC IDC MSMT LEADCHNL RV PACING THRESHOLD PULSEWIDTH: 0.5 ms
MDC IDC PG SERIAL: 7355027
MDC IDC SESS DTM: 20190315020955
MDC IDC SET LEADCHNL LV PACING AMPLITUDE: 2.5 V
MDC IDC SET LEADCHNL RV PACING AMPLITUDE: 2 V
MDC IDC STAT BRADY AP VP PERCENT: 6.6 %
MDC IDC STAT BRADY AS VS PERCENT: 1 %

## 2017-11-20 ENCOUNTER — Telehealth (HOSPITAL_COMMUNITY): Payer: Self-pay | Admitting: *Deleted

## 2017-11-20 NOTE — Telephone Encounter (Signed)
[id number]

## 2017-11-20 NOTE — Telephone Encounter (Signed)
Entresto 49-51mg  PA approved 11-16-17 thru 11/17/18.

## 2018-01-04 ENCOUNTER — Other Ambulatory Visit (HOSPITAL_COMMUNITY): Payer: Self-pay | Admitting: Cardiology

## 2018-01-11 ENCOUNTER — Encounter: Payer: 59 | Admitting: *Deleted

## 2018-01-11 ENCOUNTER — Telehealth: Payer: Self-pay | Admitting: Cardiology

## 2018-01-11 NOTE — Telephone Encounter (Signed)
Spoke with pt and reminded pt of remote transmission that is due today. Pt verbalized understanding.   

## 2018-01-15 ENCOUNTER — Encounter: Payer: Self-pay | Admitting: Cardiology

## 2018-01-24 ENCOUNTER — Ambulatory Visit (INDEPENDENT_AMBULATORY_CARE_PROVIDER_SITE_OTHER): Payer: 59 | Admitting: *Deleted

## 2018-01-24 DIAGNOSIS — I428 Other cardiomyopathies: Secondary | ICD-10-CM | POA: Diagnosis not present

## 2018-01-25 ENCOUNTER — Encounter: Payer: Self-pay | Admitting: Cardiology

## 2018-01-25 NOTE — Progress Notes (Signed)
Remote ICD transmission.   

## 2018-01-28 ENCOUNTER — Other Ambulatory Visit (HOSPITAL_COMMUNITY): Payer: Self-pay | Admitting: Cardiology

## 2018-01-28 LAB — CUP PACEART REMOTE DEVICE CHECK
Battery Voltage: 2.95 V
Brady Statistic AP VP Percent: 6.2 %
Brady Statistic AP VS Percent: 1 %
Brady Statistic AS VP Percent: 94 %
Brady Statistic AS VS Percent: 1 %
Brady Statistic RA Percent Paced: 6 %
HighPow Impedance: 100 Ohm
HighPow Impedance: 100 Ohm
Implantable Lead Implant Date: 20170512
Implantable Lead Implant Date: 20170512
Implantable Lead Location: 753858
Implantable Lead Location: 753859
Implantable Lead Location: 753860
Implantable Lead Model: 7122
Lead Channel Impedance Value: 1225 Ohm
Lead Channel Impedance Value: 650 Ohm
Lead Channel Pacing Threshold Amplitude: 0.75 V
Lead Channel Pacing Threshold Amplitude: 1 V
Lead Channel Pacing Threshold Pulse Width: 0.5 ms
Lead Channel Sensing Intrinsic Amplitude: 12 mV
Lead Channel Setting Pacing Amplitude: 2.5 V
Lead Channel Setting Pacing Pulse Width: 0.5 ms
Lead Channel Setting Pacing Pulse Width: 1 ms
MDC IDC LEAD IMPLANT DT: 20170512
MDC IDC MSMT BATTERY REMAINING LONGEVITY: 60 mo
MDC IDC MSMT BATTERY REMAINING PERCENTAGE: 68 %
MDC IDC MSMT LEADCHNL LV PACING THRESHOLD AMPLITUDE: 1.75 V
MDC IDC MSMT LEADCHNL LV PACING THRESHOLD PULSEWIDTH: 1 ms
MDC IDC MSMT LEADCHNL RA PACING THRESHOLD PULSEWIDTH: 0.5 ms
MDC IDC MSMT LEADCHNL RA SENSING INTR AMPL: 5 mV
MDC IDC MSMT LEADCHNL RV IMPEDANCE VALUE: 450 Ohm
MDC IDC PG IMPLANT DT: 20170512
MDC IDC PG SERIAL: 7355027
MDC IDC SESS DTM: 20190627013348
MDC IDC SET LEADCHNL RA PACING AMPLITUDE: 2 V
MDC IDC SET LEADCHNL RV PACING AMPLITUDE: 2 V
MDC IDC SET LEADCHNL RV SENSING SENSITIVITY: 0.5 mV

## 2018-02-01 ENCOUNTER — Other Ambulatory Visit (HOSPITAL_COMMUNITY): Payer: Self-pay | Admitting: Cardiology

## 2018-02-02 MED ORDER — SPIRONOLACTONE 25 MG PO TABS: 25.0000 mg | ORAL_TABLET | Freq: Every day | ORAL | 2 refills | Status: DC

## 2018-02-02 NOTE — Addendum Note (Signed)
Addended by: JEFFRIES, Milagros Reap on: 02/02/2018 04:38 PM   Modules accepted: Orders

## 2018-02-02 NOTE — Addendum Note (Signed)
Addended by: Antwone Capozzoli, Milagros Reap on: 02/02/2018 04:40 PM   Modules accepted: Orders

## 2018-02-02 NOTE — Telephone Encounter (Signed)
Patient returned call about medication refills.  appt made Refills provided until follow up.

## 2018-04-03 ENCOUNTER — Ambulatory Visit: Payer: 59 | Admitting: Internal Medicine

## 2018-04-03 ENCOUNTER — Encounter: Payer: Self-pay | Admitting: Internal Medicine

## 2018-04-03 VITALS — BP 120/84 | HR 84 | Ht 79.0 in | Wt 359.0 lb

## 2018-04-03 DIAGNOSIS — Z95810 Presence of automatic (implantable) cardiac defibrillator: Secondary | ICD-10-CM | POA: Diagnosis not present

## 2018-04-03 DIAGNOSIS — I5022 Chronic systolic (congestive) heart failure: Secondary | ICD-10-CM

## 2018-04-03 DIAGNOSIS — I428 Other cardiomyopathies: Secondary | ICD-10-CM

## 2018-04-03 DIAGNOSIS — I447 Left bundle-branch block, unspecified: Secondary | ICD-10-CM | POA: Diagnosis not present

## 2018-04-03 LAB — CUP PACEART INCLINIC DEVICE CHECK
Battery Remaining Longevity: 55 mo
Brady Statistic RA Percent Paced: 6.4 %
Brady Statistic RV Percent Paced: 99.96 %
HighPow Impedance: 102.375
Implantable Lead Implant Date: 20170512
Implantable Lead Implant Date: 20170512
Implantable Lead Location: 753859
Implantable Lead Location: 753860
Implantable Lead Model: 7122
Lead Channel Impedance Value: 487.5 Ohm
Lead Channel Impedance Value: 650 Ohm
Lead Channel Pacing Threshold Amplitude: 1 V
Lead Channel Pacing Threshold Amplitude: 1 V
Lead Channel Pacing Threshold Amplitude: 1.5 V
Lead Channel Pacing Threshold Amplitude: 1.5 V
Lead Channel Pacing Threshold Pulse Width: 0.5 ms
Lead Channel Pacing Threshold Pulse Width: 1 ms
Lead Channel Sensing Intrinsic Amplitude: 12 mV
Lead Channel Sensing Intrinsic Amplitude: 5 mV
Lead Channel Setting Pacing Amplitude: 2 V
Lead Channel Setting Pacing Amplitude: 2 V
Lead Channel Setting Pacing Amplitude: 2.5 V
Lead Channel Setting Pacing Pulse Width: 0.5 ms
MDC IDC LEAD IMPLANT DT: 20170512
MDC IDC LEAD LOCATION: 753858
MDC IDC MSMT LEADCHNL LV IMPEDANCE VALUE: 1212.5 Ohm
MDC IDC MSMT LEADCHNL LV PACING THRESHOLD PULSEWIDTH: 1 ms
MDC IDC MSMT LEADCHNL RA PACING THRESHOLD PULSEWIDTH: 0.5 ms
MDC IDC PG IMPLANT DT: 20170512
MDC IDC SESS DTM: 20190903172650
MDC IDC SET LEADCHNL LV PACING PULSEWIDTH: 1 ms
MDC IDC SET LEADCHNL RV SENSING SENSITIVITY: 0.5 mV
Pulse Gen Serial Number: 7355027

## 2018-04-03 NOTE — Patient Instructions (Signed)
Medication Instructions:  Your physician recommends that you continue on your current medications as directed. Please refer to the Current Medication list given to you today.  Labwork: You will have labs drawn today: CBC and BMP  Testing/Procedures: None ordered.  Follow-Up: Your physician wants you to follow-up in: One Year with Gypsy Balsam, NP. You will receive a reminder letter in the mail two months in advance. If you don't receive a letter, please call our office to schedule the follow-up appointment.   Remote monitoring is used to monitor your ICD from home. This monitoring reduces the number of office visits required to check your device to one time per year. It allows [place] to keep an eye on the functioning of your device to ensure it is working properly. You are scheduled for a device check from home on 04/25/2018. You may send your transmission at any time that day. If you have a wireless device, the transmission will be sent automatically. After your physician reviews your transmission, you will receive a postcard with your next transmission date.   Any Other Special Instructions Will Be Listed Below (If Applicable).     If you need a refill on your cardiac medications before your next appointment, please call your pharmacy.

## 2018-04-03 NOTE — Progress Notes (Signed)
Patient Care Team: Lianne Moris, PA-C as PCP - General (Family Medicine)   HPI  Ethan York is a 42 y.o. male Seen in followup for CRT-D implanted 5/17 for NICM LBBB and CHF  Mild shortness of breath but no chest pain.  tsome edema Still smoking  4-5 cigs/day  Poorly compliant Rx for OSA    DATE TEST    11/16    Echo   EF 20-25 %   3/17    Echo   EF 25 %          Records and Results Reviewed   Past Medical History:  Diagnosis Date  . Chronic systolic CHF (congestive heart failure) (HCC) 07/04/2015  . LBBB (left bundle branch block) 12/11/2015  . NICM (nonischemic cardiomyopathy) (HCC) 12/11/2015    Past Surgical History:  Procedure Laterality Date  . CARDIAC CATHETERIZATION N/A 06/18/2015   Procedure: Right/Left Heart Cath and Coronary Angiography;  Surgeon: Laurey Morale, MD;  Location: [facility] INVASIVE CV LAB;  Service: Cardiovascular;  Laterality: N/A;  . EP IMPLANTABLE DEVICE N/A 12/11/2015   Procedure: BiV ICD Insertion CRT-D;  Surgeon: Duke Salvia, MD;  Location: [facility] INVASIVE CV LAB;  Service: Cardiovascular;  Laterality: N/A;    Current Outpatient Medications  Medication Sig Dispense Refill  . acetaminophen (TYLENOL) 325 MG tablet Take 1-2 tablets (325-650 mg total) by mouth every 4 (four) hours as needed for mild pain.    . Aspirin-Acetaminophen (GOODY BODY PAIN) 500-325 MG PACK Take 1 packet by mouth daily as needed (pain).     Marland Kitchen buPROPion (WELLBUTRIN SR) 150 MG 12 hr tablet TAKE 1 TAB (150 MG) TWICE DAILY 60 tablet 3  . carvedilol (COREG) 12.5 MG tablet Take 2 tablets (25 mg total) by mouth 2 (two) times daily with a meal. 120 tablet 2  . carvedilol (COREG) 25 MG tablet Take 1 tablet (25 mg total) by mouth 2 (two) times daily. 180 tablet 3  . ENTRESTO 49-51 MG TAKE 1 TABLET BY MOUTH TWICE A DAY 60 tablet 3  . furosemide (LASIX) 40 MG tablet TAKE 1 TABLET (40 MG TOTAL) BY MOUTH DAILY. HOLD IF WEIGHT IS LESS THAN 295. NEEDS OFFICE VISIT 90 tablet 3  .  multivitamin (ONE-A-DAY MEN'S) TABS tablet Take 1 tablet by mouth daily.    Marland Kitchen spironolactone (ALDACTONE) 25 MG tablet Take 1 tablet (25 mg total) by mouth daily. 30 tablet 2   No current facility-administered medications for this visit.     No Known Allergies    Review of Systems negative except from HPI and PMH  Physical Exam BP 120/84   Pulse 84   Ht 6\' 7"  (2.007 m)   Wt (!) 359 lb (162.8 kg)   SpO2 94%   BMI 40.44 kg/m  Well developed and nourished in no acute distress HENT normal Neck supple with JVP-flat Clear Regular rate and rhythm, no murmurs or gallops Abd-soft with active BS No Clubbing cyanosis  Tr edema Skin-warm and dry A & Oriented  Grossly normal sensory and motor function   ECG sinus 84 Intervals 23/11/39  Assessment and Plan:  Nonischemic cardiomyopathy  Congestive heart failure-class II  Left bundle branch block  Morbidly obese  CRT-D  St Jude  Narrow QRS   DC heart failure next week or 2.  Will defer reassessment of LV function to them.  We will check surveillance laboratories on Aldactone and Entresto.  Euvolemic continue current meds      Current  medicines are reviewed at length with the patient today .  The patient does not  have concerns regarding medicines.

## 2018-04-04 LAB — CBC
HEMATOCRIT: 43.9 % (ref 37.5–51.0)
Hemoglobin: 14.3 g/dL (ref 13.0–17.7)
MCH: 30.6 pg (ref 26.6–33.0)
MCHC: 32.6 g/dL (ref 31.5–35.7)
MCV: 94 fL (ref 79–97)
PLATELETS: 261 10*3/uL (ref 150–450)
RBC: 4.68 x10E6/uL (ref 4.14–5.80)
RDW: 14.5 % (ref 12.3–15.4)
WBC: 10.6 10*3/uL (ref 3.4–10.8)

## 2018-04-04 LAB — BASIC METABOLIC PANEL
BUN/Creatinine Ratio: 12 (ref 9–20)
BUN: 13 mg/dL (ref 6–24)
CALCIUM: 9.6 mg/dL (ref 8.7–10.2)
CHLORIDE: 102 mmol/L (ref 96–106)
CO2: 21 mmol/L (ref 20–29)
Creatinine, Ser: 1.05 mg/dL (ref 0.76–1.27)
GFR calc Af Amer: 101 mL/min/{1.73_m2} (ref >59)
GFR calc non Af Amer: 87 mL/min/{1.73_m2} (ref >59)
GLUCOSE: 101 mg/dL — AB (ref 65–99)
Potassium: 4.4 mmol/L (ref 3.5–5.2)
Sodium: 141 mmol/L (ref 134–144)

## 2018-04-09 ENCOUNTER — Encounter (HOSPITAL_COMMUNITY): Payer: Self-pay | Admitting: Cardiology

## 2018-04-09 ENCOUNTER — Ambulatory Visit (HOSPITAL_COMMUNITY)
Admission: RE | Admit: 2018-04-09 | Discharge: 2018-04-09 | Disposition: A | Discharge status: Home / Self Care | Payer: 59 | Source: Ambulatory Visit | Attending: Cardiology | Admitting: Cardiology

## 2018-04-09 ENCOUNTER — Other Ambulatory Visit: Payer: Self-pay

## 2018-04-09 VITALS — BP 120/80 | HR 67 | Wt 360.4 lb

## 2018-04-09 DIAGNOSIS — I428 Other cardiomyopathies: Secondary | ICD-10-CM | POA: Diagnosis not present

## 2018-04-09 DIAGNOSIS — F17200 Nicotine dependence, unspecified, uncomplicated: Secondary | ICD-10-CM | POA: Insufficient documentation

## 2018-04-09 DIAGNOSIS — Z8249 Family history of ischemic heart disease and other diseases of the circulatory system: Secondary | ICD-10-CM | POA: Insufficient documentation

## 2018-04-09 DIAGNOSIS — Z79899 Other long term (current) drug therapy: Secondary | ICD-10-CM | POA: Insufficient documentation

## 2018-04-09 DIAGNOSIS — K219 Gastro-esophageal reflux disease without esophagitis: Secondary | ICD-10-CM | POA: Insufficient documentation

## 2018-04-09 DIAGNOSIS — E669 Obesity, unspecified: Secondary | ICD-10-CM | POA: Insufficient documentation

## 2018-04-09 DIAGNOSIS — Z7982 Long term (current) use of aspirin: Secondary | ICD-10-CM | POA: Diagnosis not present

## 2018-04-09 DIAGNOSIS — G4733 Obstructive sleep apnea (adult) (pediatric): Secondary | ICD-10-CM | POA: Diagnosis not present

## 2018-04-09 DIAGNOSIS — I429 Cardiomyopathy, unspecified: Secondary | ICD-10-CM | POA: Insufficient documentation

## 2018-04-09 DIAGNOSIS — I110 Hypertensive heart disease with heart failure: Secondary | ICD-10-CM | POA: Insufficient documentation

## 2018-04-09 DIAGNOSIS — I5022 Chronic systolic (congestive) heart failure: Secondary | ICD-10-CM | POA: Diagnosis present

## 2018-04-09 MED ORDER — SACUBITRIL-VALSARTAN 97-103 MG PO TABS: 1.0000 | ORAL_TABLET | Freq: Two times a day (BID) | ORAL | 6 refills | Status: DC

## 2018-04-09 MED ORDER — FUROSEMIDE 20 MG PO TABS: 20.0000 mg | ORAL_TABLET | Freq: Every day | ORAL | 6 refills | Status: DC

## 2018-04-09 NOTE — Progress Notes (Signed)
Patient ID: Ethan York, male   DOB: Nov 12, 1975, 42 y.o.   MRN: [id number]  PCP: Dr. Tommi Rumps [facility]) Cardiology: Dr. Shirlee Latch  42 y.o. with history of HTN and OSA was admitted to [facility] in 11/16 with several weeks of progressive dyspnea and was found to have acute systolic CHF with EF 20-25% by echo.  No chest pain.  He was diuresed and had left and right heart cath, showing preserved cardiac output with elevated filling pressures and no significant CAD.  Echo 3/17 with persistently decreased EF, 25%.  CPX 4/17 with moderate functional limitation due to heart failure.  He had St Jude CRT-D placed in 5/17.  Echo 10/18 showed EF 30-35% with diffuse hypokinesis.    He returns for followup of CHF.  Symptomatically doing well.  Works full time as a Curator.  He had quit smoking but back to smoking 4 cigs/day.  He has cut out soft drinks. Weight is down 6 lbs.  Has started walking a mile 3 times/week with some uphill with his wife.  No significant exertional dyspnea.  He is looking forward to hunting season, has already been walking out in the woods some.  No chest pain.  No lightheadedness.  No orthopnea/PND.  Using CPAP at night.    St Jude device interrogation: thoracic impedance stable, >99% BiV pacing, no VT.   Labs (11/16): K 3.3, creatinine 1.19, LDL 84, BNP 884, TSH normal Labs (12/16): K 4.3, creatinine 1.06, BNP 123 Labs (07/2015): K 4.8 Creatinine 1.04  Labs (09/2015): Creatinine 1.26  Labs (5/17): K 4.1, creatinine 0.96 Labs (9/18): TSH normal, K 3.8, creatihine 1.03, hgb 15.4 Labs (9/19): K 4.4, creatinine 1.05, hgb 14.3  PMH: 1. HTN 2. OSA: Using CPAP  3. GERD 4. Chronic systolic CHF: Nonischemic cardiomyopathy.   - Echo (11/16) with EF 20-25%, diffuse hypokinesis, mild-moderate MR, PASP 35 mmHg, severe LAE.  - LHC/RHC (11/16) with no significant CAD; mean RA 9, PA 50/29 mean 37, mean PCWP 22, CI 2.47, PVR 2.13, LVEDP 30.  - Unable to fit for cardiac MRI. - Echo (3/17) with EF  25%, septal-lateral dyssynchrony, mild to moderately dilated LV, normal RV size and systolic function.  - CPX (4/17): peak VO2 16.6 (54% predicted), VE/VCO2 slope 25, RER 1.13 => moderate HF limitation.  - 5/17 St Jude CRT-D placed.  - Echo (10/18): EF 30-35%, diffuse hypokinesis, normal RV size and systolic function.  5. LBBB 6. Active smoker  FH: - Brother with MI at 80 - Mother with MI in her 82s - No FH of cardiomyopathy.   SH: Married, lives in [place], Games developer.  He smoked 2.5 ppd, now down to 4 cigs/day.  Occasional ETOH.   ROS: All systems reviewed and negative except as per HPI.   Current Outpatient Medications  Medication Sig Dispense Refill  . acetaminophen (TYLENOL) 325 MG tablet Take 1-2 tablets (325-650 mg total) by mouth every 4 (four) hours as needed for mild pain.    . Aspirin-Acetaminophen (GOODY BODY PAIN) 500-325 MG PACK Take 1 packet by mouth daily as needed (pain).     Marland Kitchen buPROPion (WELLBUTRIN SR) 150 MG 12 hr tablet TAKE 1 TAB (150 MG) TWICE DAILY 60 tablet 3  . carvedilol (COREG) 25 MG tablet Take 1 tablet (25 mg total) by mouth 2 (two) times daily. 180 tablet 3  . furosemide (LASIX) 20 MG tablet Take 1 tablet (20 mg total) by mouth daily. HOLD IF WEIGHT IS LESS THAN 295 30 tablet 6  .  multivitamin (ONE-A-DAY MEN'S) TABS tablet Take 1 tablet by mouth daily.    Marland Kitchen spironolactone (ALDACTONE) 25 MG tablet Take 1 tablet (25 mg total) by mouth daily. 30 tablet 2  . sacubitril-valsartan (ENTRESTO) 97-103 MG Take 1 tablet by mouth 2 (two) times daily. 60 tablet 6   No current facility-administered medications for this encounter.    BP 120/80   Pulse 67   Wt (!) 163.5 kg (360 lb 6 oz)   SpO2 98%   BMI 40.60 kg/m  General: NAD, obese Neck: Thick, no JVD, no thyromegaly or thyroid nodule.  Lungs: Clear to auscultation bilaterally with normal respiratory effort. CV: Nondisplaced PMI.  Heart regular S1/S2, no S3/S4, no murmur.  No peripheral edema.  No carotid  bruit.  Normal pedal pulses. Venous varicosities lower legs.  Abdomen: Soft, nontender, no hepatosplenomegaly, no distention.  Skin: Intact without lesions or rashes.  Neurologic: Alert and oriented x 3.  Psych: Normal affect. Extremities: No clubbing or cyanosis.  HEENT: Normal.   Assessment/Plan: 1. Chronic systolic CHF: Nonischemic cardiomyopathy.  EF 20-25% on 11/16 echo, repeat echo in 3/17 with EF 25%, septal-lateral dyssynchrony.  Unable to fit in MRI.  Cause of cardiomyopathy still uncertain.  No significant CAD on coronary angiography.  Has family history of CAD but not cardiomyopathy.  Possible viral myocarditis, also consider LBBB cardiomyopathy. He now has Secondary school teacher CRT-D device.  Last echo in 10/18 with EF mildly increased to 30-35%.  NYHA class I-II symptoms.  He is not volume overloaded on exam or by Corevue.  - Increase Entresto to 97/103 bid with BMET in 10 days.  Can decrease Lasix to 20 mg daily.  - Continue Coreg 25 mg bid and spironolactone 25 daily.     - I will arrange for repeat echo and CPX.  2. Smoking: Working on quitting. He is going to try nicotine gum.  3. OSA: Continue nightly CPAP.  4. Obesity: Weight still well above goal.  We discussed diet/exercise and weight loss strategies.  - Check lipids with labs in 10 days.   Followup in 4 months.  [name] 04/09/2018

## 2018-04-09 NOTE — Patient Instructions (Signed)
Increase Entresto to 97/103 mg Twice daily   Decrease Furosemide (Lasix) to 20 mg daily  Your physician recommends that you return for a FASTING lipid profile: 10 days  Your physician has requested that you have an echocardiogram. Echocardiography is a painless test that uses sound waves to create images of your heart. It provides your doctor with information about the size and shape of your heart and how well your heart's chambers and valves are working. This procedure takes approximately one hour. There are no restrictions for this procedure.  Your physician has recommended that you have a cardiopulmonary stress test (CPX). CPX testing is a non-invasive measurement of heart and lung function. It replaces a traditional treadmill stress test. This type of test provides a tremendous amount of information that relates not only to your present condition but also for future outcomes. This test combines measurements of you ventilation, respiratory gas exchange in the lungs, electrocardiogram (EKG), blood pressure and physical response before, during, and following an exercise protocol.  We will contact you in 4 months to schedule your next appointment.

## 2018-04-12 ENCOUNTER — Other Ambulatory Visit (HOSPITAL_COMMUNITY): Payer: Self-pay | Admitting: *Deleted

## 2018-04-12 MED ORDER — FUROSEMIDE 20 MG PO TABS: 20.0000 mg | ORAL_TABLET | Freq: Every day | ORAL | 6 refills | Status: DC

## 2018-04-19 ENCOUNTER — Ambulatory Visit (HOSPITAL_COMMUNITY)
Admission: RE | Admit: 2018-04-19 | Discharge: 2018-04-19 | Disposition: A | Discharge status: Home / Self Care | Payer: 59 | Source: Ambulatory Visit | Attending: Internal Medicine | Admitting: Internal Medicine

## 2018-04-19 DIAGNOSIS — I5022 Chronic systolic (congestive) heart failure: Secondary | ICD-10-CM | POA: Insufficient documentation

## 2018-04-19 LAB — BASIC METABOLIC PANEL
Anion gap: 10 (ref 5–15)
BUN: 15 mg/dL (ref 6–20)
CALCIUM: 9.2 mg/dL (ref 8.9–10.3)
CO2: 25 mmol/L (ref 22–32)
Chloride: 102 mmol/L (ref 98–111)
Creatinine, Ser: 1.02 mg/dL (ref 0.61–1.24)
GFR calc Af Amer: >60 mL/min (ref >60)
Glucose, Bld: 102 mg/dL — ABNORMAL HIGH (ref 70–99)
Potassium: 4.1 mmol/L (ref 3.5–5.1)
SODIUM: 137 mmol/L (ref 135–145)

## 2018-04-19 LAB — LIPID PANEL
Cholesterol: 150 mg/dL (ref 0–200)
HDL: 27 mg/dL — AB (ref >40)
LDL CALC: 95 mg/dL (ref 0–99)
Total CHOL/HDL Ratio: 5.6 RATIO
Triglycerides: 141 mg/dL (ref <150)
VLDL: 28 mg/dL (ref 0–40)

## 2018-04-25 ENCOUNTER — Other Ambulatory Visit (HOSPITAL_COMMUNITY): Payer: Self-pay | Admitting: Cardiology

## 2018-04-25 ENCOUNTER — Encounter: Payer: 59 | Admitting: *Deleted

## 2018-04-25 ENCOUNTER — Telehealth: Payer: Self-pay

## 2018-04-25 NOTE — Telephone Encounter (Signed)
LMOVM reminding pt to send remote transmission.   

## 2018-04-27 ENCOUNTER — Encounter: Payer: Self-pay | Admitting: Cardiology

## 2018-05-01 ENCOUNTER — Ambulatory Visit (HOSPITAL_COMMUNITY): Payer: 59

## 2018-05-01 ENCOUNTER — Ambulatory Visit (HOSPITAL_COMMUNITY)
Admission: RE | Admit: 2018-05-01 | Discharge: 2018-05-01 | Disposition: A | Discharge status: Home / Self Care | Payer: 59 | Source: Ambulatory Visit | Attending: Cardiology | Admitting: Cardiology

## 2018-05-01 DIAGNOSIS — I5022 Chronic systolic (congestive) heart failure: Secondary | ICD-10-CM

## 2018-05-01 DIAGNOSIS — I428 Other cardiomyopathies: Secondary | ICD-10-CM | POA: Insufficient documentation

## 2018-05-01 DIAGNOSIS — Z720 Tobacco use: Secondary | ICD-10-CM | POA: Diagnosis not present

## 2018-05-01 MED ORDER — PERFLUTREN LIPID MICROSPHERE
1.0000 mL | INTRAVENOUS | Status: AC | PRN
  Administered 2018-05-01: 2 mL via INTRAVENOUS
  Filled 2018-05-01: qty 10

## 2018-05-04 ENCOUNTER — Other Ambulatory Visit (HOSPITAL_COMMUNITY): Payer: Self-pay | Admitting: Cardiology

## 2018-05-10 ENCOUNTER — Telehealth (HOSPITAL_COMMUNITY): Payer: Self-pay

## 2018-05-10 NOTE — Telephone Encounter (Signed)
Pt called with results follow up appointment was made for 08/07/2018 at 9am pt verbalized understanding

## 2018-05-27 ENCOUNTER — Other Ambulatory Visit: Payer: Self-pay | Admitting: Internal Medicine

## 2018-08-03 DIAGNOSIS — T1502XA Foreign body in cornea, left eye, initial encounter: Secondary | ICD-10-CM | POA: Diagnosis not present

## 2018-08-03 DIAGNOSIS — F17200 Nicotine dependence, unspecified, uncomplicated: Secondary | ICD-10-CM | POA: Diagnosis not present

## 2018-08-03 DIAGNOSIS — W208XXA Other cause of strike by thrown, projected or falling object, initial encounter: Secondary | ICD-10-CM | POA: Diagnosis not present

## 2018-08-03 DIAGNOSIS — S0085XA Superficial foreign body of other part of head, initial encounter: Secondary | ICD-10-CM | POA: Diagnosis not present

## 2018-08-03 DIAGNOSIS — L03211 Cellulitis of face: Secondary | ICD-10-CM | POA: Diagnosis not present

## 2018-08-03 DIAGNOSIS — I10 Essential (primary) hypertension: Secondary | ICD-10-CM | POA: Diagnosis not present

## 2018-08-07 ENCOUNTER — Ambulatory Visit (HOSPITAL_COMMUNITY)
Admission: RE | Admit: 2018-08-07 | Discharge: 2018-08-07 | Disposition: A | Discharge status: Home / Self Care | Payer: 59 | Source: Ambulatory Visit | Attending: Cardiology | Admitting: Cardiology

## 2018-08-07 ENCOUNTER — Encounter (HOSPITAL_COMMUNITY): Payer: Self-pay | Admitting: Cardiology

## 2018-08-07 VITALS — BP 162/86 | HR 79 | Wt 362.6 lb

## 2018-08-07 DIAGNOSIS — I5022 Chronic systolic (congestive) heart failure: Secondary | ICD-10-CM | POA: Insufficient documentation

## 2018-08-07 DIAGNOSIS — I428 Other cardiomyopathies: Secondary | ICD-10-CM | POA: Insufficient documentation

## 2018-08-07 DIAGNOSIS — G4733 Obstructive sleep apnea (adult) (pediatric): Secondary | ICD-10-CM | POA: Diagnosis not present

## 2018-08-07 DIAGNOSIS — I110 Hypertensive heart disease with heart failure: Secondary | ICD-10-CM | POA: Insufficient documentation

## 2018-08-07 DIAGNOSIS — I447 Left bundle-branch block, unspecified: Secondary | ICD-10-CM | POA: Diagnosis not present

## 2018-08-07 DIAGNOSIS — Z95810 Presence of automatic (implantable) cardiac defibrillator: Secondary | ICD-10-CM | POA: Diagnosis not present

## 2018-08-07 DIAGNOSIS — F17210 Nicotine dependence, cigarettes, uncomplicated: Secondary | ICD-10-CM | POA: Diagnosis not present

## 2018-08-07 DIAGNOSIS — K219 Gastro-esophageal reflux disease without esophagitis: Secondary | ICD-10-CM | POA: Diagnosis not present

## 2018-08-07 DIAGNOSIS — Z8249 Family history of ischemic heart disease and other diseases of the circulatory system: Secondary | ICD-10-CM | POA: Diagnosis not present

## 2018-08-07 DIAGNOSIS — Z79899 Other long term (current) drug therapy: Secondary | ICD-10-CM | POA: Diagnosis not present

## 2018-08-07 DIAGNOSIS — I77810 Thoracic aortic ectasia: Secondary | ICD-10-CM | POA: Insufficient documentation

## 2018-08-07 LAB — BASIC METABOLIC PANEL
ANION GAP: 11 (ref 5–15)
BUN: 14 mg/dL (ref 6–20)
CHLORIDE: 105 mmol/L (ref 98–111)
CO2: 22 mmol/L (ref 22–32)
CREATININE: 0.88 mg/dL (ref 0.61–1.24)
Calcium: 9 mg/dL (ref 8.9–10.3)
GFR calc non Af Amer: >60 mL/min (ref >60)
Glucose, Bld: 129 mg/dL — ABNORMAL HIGH (ref 70–99)
POTASSIUM: 3.9 mmol/L (ref 3.5–5.1)
SODIUM: 138 mmol/L (ref 135–145)

## 2018-08-07 MED ORDER — HYDROCHLOROTHIAZIDE 25 MG PO TABS: 25.0000 mg | ORAL_TABLET | Freq: Every day | ORAL | 3 refills | Status: DC

## 2018-08-07 NOTE — Progress Notes (Signed)
Patient ID: Ethan FraterSteven Kasparian, male   DOB: 1976-01-09, 43 y.o.   MRN: [id number]  [facility] Cardiology: Dr. Shirlee LatchMcLean  43 y.o. with history of HTN and OSA was [facility] 11[facility]16 with several weeks of progressive dyspnea and was found to have acute systolic CHF with EF 20-25% by echo.  No chest pain.  He was diuresed and had left and right heart cath, showing preserved cardiac output with elevated filling pressures and no significant CAD.  Echo 3/17 with persistently decreased EF, 25%.  CPX 4/17 with moderate functional limitation due to heart failure.  He had St Jude CRT-D placed in 5/17.  Echo 10/18 showed EF 30-35% with diffuse hypokinesis.  Echo in 10/19 showed EF 50-55%, aortic root 4.4 cm.   He returns for followup of CHF.  Main complaint currently is low back pain.  He has chronic back pain that waxes and wanes, worse recently. BP is elevated today but he is in a lot of pain.  Weight is stable.  No significant exertional dyspnea.  He continues to work full time but is not getting formal exercise.  No lightheadedness.  No orthopnea/PND.      St Jude device interrogation: thoracic impedance stable, >99% BiV pacing, no VT.   Labs (11/16): K 3.3, creatinine 1.19, LDL 84, BNP 884, TSH normal Labs (12/16): K 4.3, creatinine 1.06, BNP 123 Labs (07/2015): K 4.8 Creatinine 1.04  Labs (09/2015): Creatinine 1.26  Labs (5/17): K 4.1, creatinine 0.96 Labs (9/18): TSH normal, K 3.8, creatihine 1.03, hgb 15.4 Labs (9/19): K 4.4, creatinine 1.05 => 1.02, hgb 14.3, LDL 95  PMH: 1. HTN 2. OSA: Using CPAP  3. GERD 4. Chronic systolic CHF: Nonischemic cardiomyopathy.   - Echo (11/16) with EF 20-25%, diffuse hypokinesis, mild-moderate MR, PASP 35 mmHg, severe LAE.  - LHC/RHC (11/16) with no significant CAD; mean RA 9, PA 50/29 mean 37, mean PCWP 22, CI 2.47, PVR 2.13, LVEDP 30.  - Unable to fit for cardiac MRI. - Echo (3/17) with EF 25%, septal-lateral dyssynchrony, mild to  moderately dilated LV, normal RV size and systolic function.  - CPX (4/17): peak VO2 16.6 (54% predicted), VE/VCO2 slope 25, RER 1.13 => moderate HF limitation.  - 5/17 St Jude CRT-D placed.  - Echo (10/18): EF 30-35%, diffuse hypokinesis, normal RV size and systolic function.  - Echo (10/19): EF 50-55%, aortic root 4.4 cm.  - CPX (10/19): Peak VO2 16.5, VE/CO2 26, RER 1.15 => moderate functional impairment, likely due to weight/body habitus.   5. LBBB 6. Active smoker 7. Dilated aortic root: 4.4 cm on 10/19 echo.   FH: - Brother with MI at 3842 - Mother with MI in her 6750s - No FH of cardiomyopathy.   SH: Married, lives in [place], Games developerdiesel mechanic.  He smoked 2.5 ppd, now down to 4 cigs/day.  Occasional ETOH.   ROS: All systems reviewed and negative except as per HPI.   Current Outpatient Medications  Medication Sig Dispense Refill  . Aspirin-Acetaminophen (GOODY BODY PAIN) 500-325 MG PACK Take 1 packet by mouth daily as needed (pain).     Marland Kitchen. buPROPion (WELLBUTRIN SR) 150 MG 12 hr tablet TAKE 1 TAB (150 MG) TWICE DAILY 60 tablet 3  . carvedilol (COREG) 25 MG tablet TAKE 1 TABLET BY MOUTH TWICE A DAY 180 tablet 3  . multivitamin (ONE-A-DAY MEN'S) TABS tablet Take 1 tablet by mouth daily.    . sacubitril-valsartan (ENTRESTO) 97-103 MG Take 1 tablet  by mouth 2 (two) times daily. 60 tablet 6  . spironolactone (ALDACTONE) 25 MG tablet TAKE 1 TABLET BY MOUTH EVERY DAY 90 tablet 1  . hydrochlorothiazide (HYDRODIURIL) 25 MG tablet Take 1 tablet (25 mg total) by mouth daily. 90 tablet 3   No current facility-administered medications for this encounter.    BP (!) 162/86   Pulse 79   Wt (!) 164.5 kg (362 lb 9.6 oz)   SpO2 98%   BMI 40.85 kg/m  General: NAD, obese Neck: No JVD, no thyromegaly or thyroid nodule.  Lungs: Clear to auscultation bilaterally with normal respiratory effort. CV: Nondisplaced PMI.  Heart regular S1/S2, no S3/S4, no murmur.  No peripheral edema.  No carotid bruit.   Normal pedal pulses.  Abdomen: Soft, nontender, no hepatosplenomegaly, no distention.  Skin: Intact without lesions or rashes.  Neurologic: Alert and oriented x 3.  Psych: Normal affect. Extremities: No clubbing or cyanosis.  HEENT: Normal.   Assessment/Plan: 1. Chronic systolic CHF: Nonischemic cardiomyopathy.  EF 20-25% on 11/16 echo, repeat echo in 3/17 with EF 25%, septal-lateral dyssynchrony.  Unable to fit in MRI.  Cause of cardiomyopathy still uncertain.  No significant CAD on coronary angiography.  Has family history of CAD but not cardiomyopathy.  Possible viral myocarditis, also consider LBBB cardiomyopathy. He now has Secondary school teacher CRT-D device.  Echo 10/18 with EF 30-35%. Most recent echo in 10/19 showed EF up to 50-55%.  NYHA class I-II symptoms.  He is not volume overloaded on exam or by Corevue.  - Continue Entresto 97/103 bid.  - Continue Coreg 25 mg bid and spironolactone 25 daily.     - Given elevated BP and no significant volume overload, I will have him stop Lasix and start HCTZ 25 mg daily.  BMET today and again in 2 wks.   2. Smoking: Working on quitting. 3. OSA: Continue nightly CPAP.  4. Obesity: Weight still well above goal.  We discussed diet/exercise and weight loss strategies.  5. Dilated aortic root: 4.4 cm on echo, likely due to HTN.  - Arrange for CTA chest to evaluate full thoracic aorta.   Followup in 6 months.  [name] 08/07/2018

## 2018-08-07 NOTE — Patient Instructions (Addendum)
DISCONTINUE Lasix  START Hydrochlorothiazide 25mg  daily  Labs today We will only contact you if something comes back abnormal or we need to make some changes. Otherwise no news is good news!  Repeat labs in 2 weeks  Chest CT Angiography (CTA), is a special type of CT scan that uses a computer to produce multi-dimensional views of major blood vessels throughout the body. In CT angiography, a contrast material is injected through an IV to help visualize the blood vessels. Radiology will call you to schedule  Your physician recommends that you schedule a follow-up appointment in: 6 months. Please call in April to schedule your f/u.

## 2018-08-08 ENCOUNTER — Ambulatory Visit (INDEPENDENT_AMBULATORY_CARE_PROVIDER_SITE_OTHER): Payer: 59

## 2018-08-08 DIAGNOSIS — I5022 Chronic systolic (congestive) heart failure: Secondary | ICD-10-CM | POA: Diagnosis not present

## 2018-08-08 DIAGNOSIS — I428 Other cardiomyopathies: Secondary | ICD-10-CM

## 2018-08-09 NOTE — Progress Notes (Signed)
Remote ICD transmission.   

## 2018-08-21 ENCOUNTER — Ambulatory Visit (HOSPITAL_COMMUNITY): Payer: 59

## 2018-08-21 ENCOUNTER — Other Ambulatory Visit (HOSPITAL_COMMUNITY): Payer: 59

## 2018-08-29 ENCOUNTER — Ambulatory Visit (HOSPITAL_COMMUNITY)
Admission: RE | Admit: 2018-08-29 | Discharge: 2018-08-29 | Disposition: A | Discharge status: Home / Self Care | Payer: 59 | Source: Ambulatory Visit | Attending: Internal Medicine | Admitting: Internal Medicine

## 2018-08-29 ENCOUNTER — Ambulatory Visit (HOSPITAL_COMMUNITY)
Admission: RE | Admit: 2018-08-29 | Discharge: 2018-08-29 | Disposition: A | Discharge status: Home / Self Care | Payer: 59 | Source: Ambulatory Visit | Attending: Cardiology | Admitting: Cardiology

## 2018-08-29 DIAGNOSIS — I5022 Chronic systolic (congestive) heart failure: Secondary | ICD-10-CM | POA: Diagnosis not present

## 2018-08-29 DIAGNOSIS — I509 Heart failure, unspecified: Secondary | ICD-10-CM | POA: Diagnosis not present

## 2018-08-29 LAB — BASIC METABOLIC PANEL
Anion gap: 8 (ref 5–15)
BUN: 13 mg/dL (ref 6–20)
CHLORIDE: 101 mmol/L (ref 98–111)
CO2: 26 mmol/L (ref 22–32)
Calcium: 9.7 mg/dL (ref 8.9–10.3)
Creatinine, Ser: 0.9 mg/dL (ref 0.61–1.24)
GFR calc Af Amer: >60 mL/min (ref >60)
GLUCOSE: 93 mg/dL (ref 70–99)
POTASSIUM: 4 mmol/L (ref 3.5–5.1)
Sodium: 135 mmol/L (ref 135–145)

## 2018-08-29 MED ORDER — IOPAMIDOL (ISOVUE-370) INJECTION 76%
INTRAVENOUS | Status: AC
  Administered 2018-08-29: 17:00:00
  Filled 2018-08-29: qty 100

## 2018-09-15 DIAGNOSIS — J0101 Acute recurrent maxillary sinusitis: Secondary | ICD-10-CM | POA: Diagnosis not present

## 2018-09-15 DIAGNOSIS — Z6839 Body mass index (BMI) 39.0-39.9, adult: Secondary | ICD-10-CM | POA: Diagnosis not present

## 2018-10-04 ENCOUNTER — Other Ambulatory Visit: Payer: Self-pay

## 2018-10-25 ENCOUNTER — Telehealth (HOSPITAL_COMMUNITY): Payer: Self-pay | Admitting: Cardiology

## 2018-10-25 NOTE — Telephone Encounter (Signed)
PA REQUEST RECEIVED 10/24/18 FOR ENTRESTO HOWEVER PA ON FILE  [id number] VALID 11/16/17 -11/17/19  NOTHING FURTHER NEEDED AT THIS TIME

## 2018-10-30 ENCOUNTER — Other Ambulatory Visit (HOSPITAL_COMMUNITY): Payer: Self-pay | Admitting: Cardiology

## 2018-11-07 ENCOUNTER — Telehealth: Payer: Self-pay

## 2018-11-07 ENCOUNTER — Other Ambulatory Visit: Payer: Self-pay

## 2018-11-07 ENCOUNTER — Encounter: Payer: 59 | Admitting: *Deleted

## 2018-11-07 NOTE — Telephone Encounter (Signed)
Spoke with patient to remind of missed remote transmission 

## 2018-11-16 ENCOUNTER — Encounter: Payer: Self-pay | Admitting: Cardiology

## 2018-11-16 NOTE — Progress Notes (Signed)
Letter  

## 2019-01-12 ENCOUNTER — Other Ambulatory Visit (HOSPITAL_COMMUNITY): Payer: Self-pay | Admitting: Cardiology

## 2019-03-27 ENCOUNTER — Other Ambulatory Visit (HOSPITAL_COMMUNITY): Payer: Self-pay | Admitting: Cardiology

## 2019-04-16 ENCOUNTER — Encounter: Payer: Self-pay | Admitting: Nurse Practitioner

## 2019-05-02 NOTE — Progress Notes (Deleted)
Electrophysiology Office Note Date: 05/02/2019  ID:  Ethan York, DOB Nov 25, 1975, MRN [id number]  PCP: Ethan Moris, PA-C Primary Cardiologist: No primary care provider on file. Electrophysiologist: None  CC: Routine ICD follow-up  Ethan York is a 43 y.o. male seen today for Dr. Graciela York.  They present today for routine electrophysiology followup.  Since last being seen in our clinic, the patient reports doing very well. They deny chest pain, palpitations, dyspnea, PND, orthopnea, nausea, vomiting, dizziness, syncope, edema, weight gain, or early satiety.  He has not had ICD shocks. ***  Device History: St. Jude BiV ICD implanted 11/2015 for LBBB and CHF History of appropriate therapy: No History of AAD therapy: No   Past Medical History:  Diagnosis Date  . Chronic systolic CHF (congestive heart failure) (HCC) 07/04/2015  . LBBB (left bundle branch block) 12/11/2015  . NICM (nonischemic cardiomyopathy) (HCC) 12/11/2015   Past Surgical History:  Procedure Laterality Date  . CARDIAC CATHETERIZATION N/A 06/18/2015   Procedure: Right/Left Heart Cath and Coronary Angiography;  Surgeon: Ethan Morale, MD;  Location: [facility] INVASIVE CV LAB;  Service: Cardiovascular;  Laterality: N/A;  . EP IMPLANTABLE DEVICE N/A 12/11/2015   Procedure: BiV ICD Insertion CRT-D;  Surgeon: Ethan Salvia, MD;  [facility] INVASIVE CV LAB;  Service: Cardiovascular;  Laterality: N/A;    Current Outpatient Medications  Medication Sig Dispense Refill  . Aspirin-Acetaminophen (GOODY BODY PAIN) 500-325 MG PACK Take 1 packet by mouth daily as needed (pain).     Ethan York buPROPion (WELLBUTRIN SR) 150 MG 12 hr tablet TAKE 1 TAB (150 MG) TWICE DAILY 60 tablet 3  . carvedilol (COREG) 25 MG tablet TAKE 1 TABLET BY MOUTH TWICE A DAY 180 tablet 3  . ENTRESTO 97-103 MG TAKE 1 TABLET BY MOUTH TWICE A DAY 60 tablet 6  . hydrochlorothiazide (HYDRODIURIL) 25 MG tablet Take 1 tablet (25 mg total) by mouth daily. 90 tablet 3  .  multivitamin (ONE-A-DAY MEN'S) TABS tablet Take 1 tablet by mouth daily.    Ethan York spironolactone (ALDACTONE) 25 MG tablet TAKE 1 TABLET BY MOUTH EVERY DAY 90 tablet 1   No current facility-administered medications for this visit.     Allergies:   Patient has no known allergies.   Social History: Social History   Socioeconomic History  . Marital status: Married    Spouse name: Not on file  . Number of children: Not on file  . Years of education: Not on file  . Highest education level: Not on file  Occupational History  . Not on file  Social Needs  . Financial resource strain: Not on file  . Food insecurity    Worry: Not on file    Inability: Not on file  . Transportation needs    Medical: Not on file    Non-medical: Not on file  Tobacco Use  . Smoking status: Current Every Day Smoker    Types: Cigarettes  . Smokeless tobacco: Never Used  Substance and Sexual Activity  . Alcohol use: No    Alcohol/week: 0.0 standard drinks  . Drug use: No  . Sexual activity: Not on file  Lifestyle  . Physical activity    Days per week: Not on file    Minutes per session: Not on file  . Stress: Not on file  Relationships  . Social Musician on phone: Not on file    Gets together: Not on file    Attends religious service: Not on  file    Active member of club or organization: Not on file    Attends meetings of clubs or organizations: Not on file    Relationship status: Not on file  . Intimate partner violence    Fear of current or ex partner: Not on file    Emotionally abused: Not on file    Physically abused: Not on file    Forced sexual activity: Not on file  Other Topics Concern  . Not on file  Social History Narrative  . Not on file    Family History: Family History  Problem Relation Age of Onset  . Heart disease Mother   . Ovarian cancer Mother   . Diabetes Father   . Lung cancer Father   . Hyperlipidemia Father   . Hypertension Father     Review of  Systems: All other systems reviewed and are otherwise negative except as noted above.   Physical Exam: There were no vitals filed for this visit.   GEN- The patient is well appearing, alert and oriented x 3 today.   HEENT: normocephalic, atraumatic; sclera clear, conjunctiva pink; hearing intact; oropharynx clear; neck supple, no JVP Lymph- no cervical lymphadenopathy Lungs- Clear to ausculation bilaterally, normal work of breathing.  No wheezes, rales, rhonchi Heart- Regular rate and rhythm, no murmurs, rubs or gallops, PMI not laterally displaced GI- soft, non-tender, non-distended, bowel sounds present, no hepatosplenomegaly Extremities- no clubbing, cyanosis, or edema; DP/PT/radial pulses 2+ bilaterally MS- no significant deformity or atrophy Skin- warm and dry, no rash or lesion; ICD pocket well healed Psych- euthymic mood, full affect Neuro- strength and sensation are intact  ICD interrogation- reviewed in detail today,  See PACEART report  EKG:  EKG is ordered today. The ekg ordered today shows ***  Recent Labs: 08/29/2018: BUN 13; Creatinine, Ser 0.90; Potassium 4.0; Sodium 135   Wt Readings from Last 3 Encounters:  08/07/18 (!) 362 lb 9.6 oz (164.5 kg)  04/09/18 (!) 360 lb 6 oz (163.5 kg)  04/03/18 (!) 359 lb (162.8 kg)     Other studies Reviewed: Additional studies/ records that were reviewed today include: ***   Assessment and Plan:  1.  Chronic systolic dysfunction, NICM euvolemic today Stable on an appropriate medical regimen Normal ICD function See Pace Art report No changes today He saw Dr. Aundra Dubin this am.   2. Morbid obesity There is no height or weight on file to calculate BMI.    Current medicines are reviewed at length with the patient today.   The patient {ACTIONS; HAS/DOES NOT HAVE:19233} concerns regarding his medicines.  The following changes were made today:  {NONE DEFAULTED:18576::"none"}  Labs/ tests ordered today include: *** No orders  of the defined types were placed in this encounter.    Disposition:   Follow up with *** {gen number [id number]} {TIME; UNITS DAY/WEEK/MONTH:19136}   Signed, Shirley Friar, PA-C  05/02/2019 1:47 PM  [address] [place] [address] [phone] (office) [phone] (fax)

## 2019-05-03 ENCOUNTER — Encounter: Payer: Self-pay | Admitting: Student

## 2019-05-03 ENCOUNTER — Encounter (HOSPITAL_COMMUNITY): Payer: 59 | Admitting: Cardiology

## 2019-05-28 ENCOUNTER — Ambulatory Visit (HOSPITAL_COMMUNITY)
Admission: RE | Admit: 2019-05-28 | Discharge: 2019-05-28 | Disposition: A | Discharge status: Home / Self Care | Payer: No Typology Code available for payment source | Source: Ambulatory Visit | Attending: Cardiology | Admitting: Cardiology

## 2019-05-28 ENCOUNTER — Encounter (HOSPITAL_COMMUNITY): Payer: Self-pay | Admitting: Cardiology

## 2019-05-28 ENCOUNTER — Other Ambulatory Visit: Payer: Self-pay

## 2019-05-28 VITALS — BP 126/76 | HR 82 | Wt 367.2 lb

## 2019-05-28 DIAGNOSIS — Z8249 Family history of ischemic heart disease and other diseases of the circulatory system: Secondary | ICD-10-CM | POA: Diagnosis not present

## 2019-05-28 DIAGNOSIS — F17210 Nicotine dependence, cigarettes, uncomplicated: Secondary | ICD-10-CM | POA: Insufficient documentation

## 2019-05-28 DIAGNOSIS — I5022 Chronic systolic (congestive) heart failure: Secondary | ICD-10-CM | POA: Diagnosis not present

## 2019-05-28 DIAGNOSIS — Z7982 Long term (current) use of aspirin: Secondary | ICD-10-CM | POA: Insufficient documentation

## 2019-05-28 DIAGNOSIS — E669 Obesity, unspecified: Secondary | ICD-10-CM | POA: Diagnosis not present

## 2019-05-28 DIAGNOSIS — I83893 Varicose veins of bilateral lower extremities with other complications: Secondary | ICD-10-CM

## 2019-05-28 DIAGNOSIS — Z6841 Body Mass Index (BMI) 40.0 and over, adult: Secondary | ICD-10-CM | POA: Insufficient documentation

## 2019-05-28 DIAGNOSIS — I428 Other cardiomyopathies: Secondary | ICD-10-CM | POA: Diagnosis not present

## 2019-05-28 DIAGNOSIS — Z79899 Other long term (current) drug therapy: Secondary | ICD-10-CM | POA: Diagnosis not present

## 2019-05-28 DIAGNOSIS — I110 Hypertensive heart disease with heart failure: Secondary | ICD-10-CM | POA: Diagnosis not present

## 2019-05-28 DIAGNOSIS — G4733 Obstructive sleep apnea (adult) (pediatric): Secondary | ICD-10-CM | POA: Diagnosis not present

## 2019-05-28 LAB — LIPID PANEL
Cholesterol: 157 mg/dL (ref 0–200)
HDL: 29 mg/dL — ABNORMAL LOW (ref >40)
LDL Cholesterol: 94 mg/dL (ref 0–99)
Total CHOL/HDL Ratio: 5.4 RATIO
Triglycerides: 172 mg/dL — ABNORMAL HIGH (ref <150)
VLDL: 34 mg/dL (ref 0–40)

## 2019-05-28 LAB — BASIC METABOLIC PANEL
Anion gap: 11 (ref 5–15)
BUN: 10 mg/dL (ref 6–20)
CO2: 26 mmol/L (ref 22–32)
Calcium: 9.4 mg/dL (ref 8.9–10.3)
Chloride: 99 mmol/L (ref 98–111)
Creatinine, Ser: 1.06 mg/dL (ref 0.61–1.24)
GFR calc Af Amer: >60 mL/min (ref >60)
GFR calc non Af Amer: >60 mL/min (ref >60)
Glucose, Bld: 107 mg/dL — ABNORMAL HIGH (ref 70–99)
Potassium: 3.6 mmol/L (ref 3.5–5.1)
Sodium: 136 mmol/L (ref 135–145)

## 2019-05-28 NOTE — Patient Instructions (Signed)
Labs today and repeat in 3 months at [facility] office.  We will only contact you if something comes back abnormal or we need to make some changes. Otherwise no news is good news!  Your physician has requested that you have an echocardiogram. Echocardiography is a painless test that uses sound waves to create images of your heart. It provides your doctor with information about the size and shape of your heart and how well your heart's chambers and valves are working. This procedure takes approximately one hour. There are no restrictions for this procedure.  Your physician recommends that you schedule a follow-up appointment in: 6 months with Dr Aundra Dubin. You will get a call to schedule this appointment.  At the [facility], you and your health needs are our priority. As part of our continuing mission to provide you with exceptional heart care, we have created designated Provider Care Teams. These Care Teams include your primary Cardiologist (physician) and Advanced Practice Providers (APPs- Physician Assistants and Nurse Practitioners) who all work together to provide you with the care you need, when you need it.   You may see any of the following providers on your designated Care Team at your next follow up: Marland Kitchen Dr Glori Bickers . Dr Loralie Champagne . Darrick Grinder, NP . Lyda Jester, PA   Please be sure to bring in all your medications bottles to every appointment.

## 2019-05-29 NOTE — Progress Notes (Signed)
Patient ID: Ethan York, male   DOB: 1976-07-06, 43 y.o.   MRN: [id number]  PCP: Dr. Tommi Rumps [facility]) Cardiology: Dr. Shirlee Latch  43 y.o. with history of HTN and OSA was admitted to [facility] in 11/16 with several weeks of progressive dyspnea and was found to have acute systolic CHF with EF 20-25% by echo.  No chest pain.  He was diuresed and had left and right heart cath, showing preserved cardiac output with elevated filling pressures and no significant CAD.  Echo 3/17 with persistently decreased EF, 25%.  CPX 4/17 with moderate functional limitation due to heart failure.  He had St Jude CRT-D placed in 5/17.  Echo 10/18 showed EF 30-35% with diffuse hypokinesis.  Echo in 10/19 showed EF 50-55%, aortic root 4.4 cm. CTA chest in 1/20 showed no ascending aorta or root aneurysm.   He returns for followup of CHF.  He has been doing well.  He still has not lost weight, in fact weight is up 5 lbs.  Busy at home, has grandchildren living with him now.  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  No significant exertional dyspnea.  No orthopnea/PND.  No chest pain.  No lightheadedness.  Still smoking.   ECG (personally reviewed): NSR, BiV paced  Labs (11/16): K 3.3, creatinine 1.19, LDL 84, BNP 884, TSH normal Labs (12/16): K 4.3, creatinine 1.06, BNP 123 Labs (07/2015): K 4.8 Creatinine 1.04  Labs (09/2015): Creatinine 1.26  Labs (5/17): K 4.1, creatinine 0.96 Labs (9/18): TSH normal, K 3.8, creatihine 1.03, hgb 15.4 Labs (9/19): K 4.4, creatinine 1.05 => 1.02 => 0.9, hgb 14.3, LDL 95 Labs (1/20): LDL 95  PMH: 1. HTN 2. OSA: Using CPAP  3. GERD 4. Chronic systolic CHF: Nonischemic cardiomyopathy.   - Echo (11/16) with EF 20-25%, diffuse hypokinesis, mild-moderate MR, PASP 35 mmHg, severe LAE.  - LHC/RHC (11/16) with no significant CAD; mean RA 9, PA 50/29 mean 37, mean PCWP 22, CI 2.47, PVR 2.13, LVEDP 30.  - Unable to fit for cardiac MRI. - Echo (3/17) with EF 25%, septal-lateral  dyssynchrony, mild to moderately dilated LV, normal RV size and systolic function.  - CPX (4/17): peak VO2 16.6 (54% predicted), VE/VCO2 slope 25, RER 1.13 => moderate HF limitation.  - 5/17 St Jude CRT-D placed.  - Echo (10/18): EF 30-35%, diffuse hypokinesis, normal RV size and systolic function.  - Echo (10/19): EF 50-55%, aortic root 4.4 cm.  - CPX (10/19): Peak VO2 16.5, VE/CO2 26, RER 1.15 => moderate functional impairment, likely due to weight/body habitus.   5. LBBB 6. Active smoker 7. Dilated aortic root: 4.4 cm on 10/19 echo.  - CTA chest (1/20) did not show dilated root or ascending aorta.   FH: - Brother with MI at 25 - Mother with MI in her 86s - No FH of cardiomyopathy.   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  He smoked 2.5 ppd, now down to 4 cigs/day.  Occasional ETOH.   ROS: All systems reviewed and negative except as per HPI.   Current Outpatient Medications  Medication Sig Dispense Refill  . Aspirin-Acetaminophen (GOODY BODY PAIN) 500-325 MG PACK Take 1 packet by mouth daily as needed (pain).     . carvedilol (COREG) 25 MG tablet TAKE 1 TABLET BY MOUTH TWICE A DAY 180 tablet 3  . ENTRESTO 97-103 MG TAKE 1 TABLET BY MOUTH TWICE A DAY  60 tablet 6  . hydrochlorothiazide (HYDRODIURIL) 25 MG tablet Take 1 tablet (25 mg total) by mouth daily. 90 tablet 3  . multivitamin (ONE-A-DAY MEN'S) TABS tablet Take 1 tablet by mouth daily.    Marland Kitchen spironolactone (ALDACTONE) 25 MG tablet TAKE 1 TABLET BY MOUTH EVERY DAY 90 tablet 1   No current facility-administered medications for this encounter.    BP 126/76   Pulse 82   Wt (!) 166.6 kg (367 lb 3.2 oz)   SpO2 97%   BMI 41.37 kg/m  General: NAD, obese. Neck: No JVD, no thyromegaly or thyroid nodule.  Lungs: Clear to auscultation bilaterally with normal respiratory effort. CV: Nondisplaced PMI.  Heart regular S1/S2, no S3/S4, no murmur.  No peripheral edema.  No carotid  bruit.  Normal pedal pulses.  Abdomen: Soft, nontender, no hepatosplenomegaly, no distention.  Skin: Intact without lesions or rashes.  Neurologic: Alert and oriented x 3.  Psych: Normal affect. Extremities: No clubbing or cyanosis.  HEENT: Normal.   Assessment/Plan: 1. Chronic systolic CHF: Nonischemic cardiomyopathy.  EF 20-25% on 11/16 echo, repeat echo in 3/17 with EF 25%, septal-lateral dyssynchrony.  Unable to fit in MRI.  Cause of cardiomyopathy still uncertain.  No significant CAD on coronary angiography.  Has family history of CAD but not cardiomyopathy.  Possible viral myocarditis, also consider LBBB cardiomyopathy. He now has Research officer[employer] CRT-D device.  Echo 10/18 with EF 30-35%. Most recent echo in 10/19 showed EF up to 50-55%.  NYHA class I-II symptoms.  He is not volume overloaded on exam.  - Continue Entresto 97/103 bid.  - Continue Coreg 25 mg bid and spironolactone 25 daily.     - I will arrange for repeat echo to make sure EF remains near normal to normal.   - BMET today and every 3 months.  2. Smoking: Working on quitting. 3. OSA: Continue nightly CPAP.  4. Obesity: Weight still well above goal.  We discussed diet/exercise and weight loss strategies.  5. Dilated aortic root: CTA chest did not show significant dilation of the thoracic aorta.  6. HTN: Continue HCTZ in addition to the above meds.   Followup in 6 months.  Loralie Champagne 05/29/2019

## 2019-06-11 ENCOUNTER — Other Ambulatory Visit: Payer: Self-pay

## 2019-06-11 ENCOUNTER — Ambulatory Visit (HOSPITAL_COMMUNITY)
Admission: RE | Admit: 2019-06-11 | Discharge: 2019-06-11 | Disposition: A | Discharge status: Home / Self Care | Payer: PRIVATE HEALTH INSURANCE | Source: Ambulatory Visit | Attending: Family Medicine | Admitting: Family Medicine

## 2019-06-11 DIAGNOSIS — I110 Hypertensive heart disease with heart failure: Secondary | ICD-10-CM | POA: Diagnosis present

## 2019-06-11 DIAGNOSIS — Z950 Presence of cardiac pacemaker: Secondary | ICD-10-CM | POA: Insufficient documentation

## 2019-06-11 DIAGNOSIS — I5022 Chronic systolic (congestive) heart failure: Secondary | ICD-10-CM | POA: Diagnosis not present

## 2019-06-11 MED ORDER — PERFLUTREN LIPID MICROSPHERE
1.0000 mL | INTRAVENOUS | Status: AC | PRN
  Administered 2019-06-11: 1.5 mL via INTRAVENOUS
  Filled 2019-06-11: qty 10

## 2019-06-11 NOTE — Progress Notes (Signed)
Echocardiogram 2D Echocardiogram has been performed.  Oneal Deputy Almedia Cordell 06/11/2019, 11:07 AM

## 2019-07-28 ENCOUNTER — Other Ambulatory Visit (HOSPITAL_COMMUNITY): Payer: Self-pay | Admitting: Cardiology

## 2019-08-09 ENCOUNTER — Other Ambulatory Visit (HOSPITAL_COMMUNITY): Payer: Self-pay | Admitting: Cardiology

## 2019-09-05 ENCOUNTER — Ambulatory Visit (INDEPENDENT_AMBULATORY_CARE_PROVIDER_SITE_OTHER): Payer: No Typology Code available for payment source | Admitting: *Deleted

## 2019-09-05 DIAGNOSIS — I428 Other cardiomyopathies: Secondary | ICD-10-CM

## 2019-09-05 LAB — CUP PACEART REMOTE DEVICE CHECK
Date Time Interrogation Session: 20210204054911
Implantable Lead Implant Date: 20170511200000
Implantable Lead Implant Date: 20170511200000
Implantable Lead Implant Date: 20170511200000
Implantable Lead Location: 753858
Implantable Lead Location: 753859
Implantable Lead Location: 753860
Implantable Lead Model: 7122
Implantable Pulse Generator Implant Date: 20170511200000
Lead Channel Setting Pacing Amplitude: 2 V
Lead Channel Setting Pacing Amplitude: 2.125
Lead Channel Setting Pacing Amplitude: 2.5 V
Lead Channel Setting Pacing Pulse Width: 0.5 ms
Lead Channel Setting Pacing Pulse Width: 1 ms
Lead Channel Setting Sensing Sensitivity: 0.5 mV
Pulse Gen Serial Number: 7355027

## 2019-09-06 NOTE — Progress Notes (Signed)
ICD Remote  

## 2019-12-05 ENCOUNTER — Ambulatory Visit (INDEPENDENT_AMBULATORY_CARE_PROVIDER_SITE_OTHER): Payer: No Typology Code available for payment source | Admitting: *Deleted

## 2019-12-05 DIAGNOSIS — I428 Other cardiomyopathies: Secondary | ICD-10-CM | POA: Diagnosis not present

## 2019-12-06 ENCOUNTER — Telehealth: Payer: Self-pay

## 2019-12-06 NOTE — Telephone Encounter (Signed)
Spoke with patient to remind of missed remote transmission 

## 2019-12-07 LAB — CUP PACEART REMOTE DEVICE CHECK
Battery Remaining Longevity: 40 mo
Battery Remaining Percentage: 46 %
Battery Voltage: 2.92 V
Brady Statistic AP VP Percent: 6.1 %
Brady Statistic AP VS Percent: 1 %
Brady Statistic AS VP Percent: 94 %
Brady Statistic AS VS Percent: 1 %
Brady Statistic RA Percent Paced: 5.9 %
Date Time Interrogation Session: 20210507170642
HighPow Impedance: 83 Ohm
HighPow Impedance: 83 Ohm
Implantable Lead Implant Date: 20170512
Implantable Lead Implant Date: 20170512
Implantable Lead Implant Date: 20170512
Implantable Lead Location: 753858
Implantable Lead Location: 753859
Implantable Lead Location: 753860
Implantable Lead Model: 7122
Implantable Pulse Generator Implant Date: 20170512
Lead Channel Impedance Value: 1125 Ohm
Lead Channel Impedance Value: 450 Ohm
Lead Channel Impedance Value: 590 Ohm
Lead Channel Pacing Threshold Amplitude: 1 V
Lead Channel Pacing Threshold Amplitude: 1.25 V
Lead Channel Pacing Threshold Amplitude: 1.5 V
Lead Channel Pacing Threshold Pulse Width: 0.5 ms
Lead Channel Pacing Threshold Pulse Width: 0.5 ms
Lead Channel Pacing Threshold Pulse Width: 1 ms
Lead Channel Sensing Intrinsic Amplitude: 12 mV
Lead Channel Sensing Intrinsic Amplitude: 5 mV
Lead Channel Setting Pacing Amplitude: 2 V
Lead Channel Setting Pacing Amplitude: 2.25 V
Lead Channel Setting Pacing Amplitude: 2.5 V
Lead Channel Setting Pacing Pulse Width: 0.5 ms
Lead Channel Setting Pacing Pulse Width: 1 ms
Lead Channel Setting Sensing Sensitivity: 0.5 mV
Pulse Gen Serial Number: 7355027

## 2019-12-09 NOTE — Progress Notes (Signed)
Remote ICD transmission.   

## 2020-01-01 ENCOUNTER — Telehealth (HOSPITAL_COMMUNITY): Payer: Self-pay | Admitting: *Deleted

## 2020-01-01 NOTE — Telephone Encounter (Signed)
Pt left VM on pharmacy line requesting a refill on a medication he used to be on Furosemide. I called pt to tell him we cant refill that medication the doctor took him off of that medicine. No answer/left VM so I can get more information as to why patient needs medication. Pt last office visit 05/28/2019

## 2020-01-13 ENCOUNTER — Other Ambulatory Visit: Payer: Self-pay

## 2020-01-13 ENCOUNTER — Encounter (HOSPITAL_COMMUNITY): Payer: Self-pay | Admitting: *Deleted

## 2020-01-13 ENCOUNTER — Emergency Department (HOSPITAL_COMMUNITY)
Admission: EM | Admit: 2020-01-13 | Discharge: 2020-01-14 | Disposition: A | Discharge status: Home / Self Care | Payer: BC Managed Care – PPO | Attending: Emergency Medicine | Admitting: Emergency Medicine

## 2020-01-13 DIAGNOSIS — F17210 Nicotine dependence, cigarettes, uncomplicated: Secondary | ICD-10-CM | POA: Diagnosis not present

## 2020-01-13 DIAGNOSIS — I83891 Varicose veins of right lower extremities with other complications: Secondary | ICD-10-CM

## 2020-01-13 NOTE — ED Triage Notes (Signed)
Pt states he put on his pants this am and his right lower leg started bleeding due to a varicose vein; pt states he called ems and they wrapped his leg this am but tonight when he went to take off the dressing it started bleeding

## 2020-01-14 ENCOUNTER — Telehealth (HOSPITAL_COMMUNITY): Payer: Self-pay | Admitting: *Deleted

## 2020-01-14 MED ORDER — TRANEXAMIC ACID FOR EPISTAXIS
500.0000 mg | Freq: Once | TOPICAL | Status: AC
  Administered 2020-01-14: 500 mg via TOPICAL

## 2020-01-14 MED ORDER — SILVER NITRATE-POT NITRATE 75-25 % EX MISC
1.0000 "application " | Freq: Once | CUTANEOUS | Status: AC
  Administered 2020-01-14: 1 via TOPICAL
  Filled 2020-01-14: qty 10

## 2020-01-14 MED ORDER — "THROMBI-PAD 3""X3"" EX PADS"
1.0000 | MEDICATED_PAD | Freq: Once | CUTANEOUS | Status: AC
  Administered 2020-01-14: 1 via TOPICAL

## 2020-01-14 NOTE — Telephone Encounter (Signed)
Pt left vm stating Dr.McLean suggested he have a procedure done to his veins a while ago. Pt was seen in the ED yesterday note forwarded to Dr.McLean. I will follow up with pt after Dr.McLean responds.

## 2020-01-14 NOTE — ED Provider Notes (Signed)
[facility] EMERGENCY DEPARTMENT Provider Note   CSN: [id number] Arrival date & time: 01/13/20  2233   Time seen 2:20 AM  History Chief Complaint  Patient presents with  . Bleeding/Bruising    Ethan York is a 44 y.o. male.  HPI   Patient states the morning of June 14 he was getting dressed and when he pulled up his pants he did not realize his leg it started bleeding.  EMS was called out and put a bandage on his leg which she left on all day.  He states he took off the bandage tonight and it started shooting blood again.  He states they have never done this before.  PCP Lanelle Bal, PA-C   Past Medical History:  Diagnosis Date  . Chronic systolic CHF (congestive heart failure) ([place]) 07/04/2015  . LBBB (left bundle branch block) 12/11/2015  . NICM (nonischemic cardiomyopathy) ([place]) 12/11/2015    Patient Active Problem List   Diagnosis Date Noted  . Biventricular ICD implanted 12/11/15 12/12/2015  . NICM (nonischemic cardiomyopathy) ([place]) 12/11/2015  . LBBB (left bundle branch block) 12/11/2015  . OSA on CPAP 08/21/2015  . Smoking 07/20/2015  . Chronic systolic CHF (congestive heart failure) ([place]) 07/04/2015    Past Surgical History:  Procedure Laterality Date  . CARDIAC CATHETERIZATION N/A 06/18/2015   Procedure: Right/Left Heart Cath and Coronary Angiography;  Surgeon: Larey Dresser, MD;  Location: [place] [facility];  Service: Cardiovascular;  Laterality: N/A;  . EP IMPLANTABLE DEVICE N/A 12/11/2015   Procedure: BiV ICD Insertion CRT-D;  Surgeon: Deboraha Sprang, MD;  Location: [place] [facility];  Service: Cardiovascular;  Laterality: N/A;       Family History  Problem Relation Age of Onset  . Heart disease Mother   . Ovarian cancer Mother   . Diabetes Father   . Lung cancer Father   . Hyperlipidemia Father   . Hypertension Father     Social History   Tobacco Use  . Smoking status: Current Some Day Smoker    Packs/day: 1.00    Types: Cigarettes  .  Smokeless tobacco: Never Used  Substance Use Topics  . Alcohol use: No    Alcohol/week: 0.0 standard drinks  . Drug use: No    Home Medications Prior to Admission medications   Medication Sig Start Date End Date Taking? Authorizing Provider  Aspirin-Acetaminophen (GOODY BODY PAIN) 500-325 MG PACK Take 1 packet by mouth daily as needed (pain).     [provider]  carvedilol (COREG) 25 MG tablet TAKE 1 TABLET BY MOUTH TWICE A DAY 05/28/18   Deboraha Sprang, MD  ENTRESTO 97-103 MG TAKE 1 TABLET BY MOUTH TWICE A DAY 01/14/19   Larey Dresser, MD  hydrochlorothiazide (HYDRODIURIL) 25 MG tablet TAKE 1 TABLET BY MOUTH EVERY DAY 08/09/19   Larey Dresser, MD  multivitamin (ONE-A-DAY MEN'S) TABS tablet Take 1 tablet by mouth daily.    [provider]  spironolactone (ALDACTONE) 25 MG tablet TAKE 1 TABLET BY MOUTH EVERY DAY 07/29/19   Larey Dresser, MD    Allergies    Patient has no known allergies.  Review of Systems   Review of Systems  All other systems reviewed and are negative.   Physical Exam Updated Vital Signs BP 124/68   Pulse 66   Temp 98 F (36.7 C) (Oral)   Resp 17   Ht 6\' 7"  (2.007 m)   Wt (!) 163.3 kg   SpO2 96%  BMI 40.56 kg/m   Physical Exam Vitals and nursing note reviewed.  Constitutional:      Appearance: Normal appearance. He is obese.  HENT:     Head: Normocephalic and atraumatic.  Eyes:     Extraocular Movements: Extraocular movements intact.     Conjunctiva/sclera: Conjunctivae normal.  Pulmonary:     Effort: Pulmonary effort is normal. No respiratory distress.  Musculoskeletal:        General: Normal range of motion.     Cervical back: Normal range of motion.     Comments: Patient is noted to have multiple superficial varicosities.  He has a wrap on his right lower leg.  When I remove it there is no active bleeding but there is a lesion that appears to have been the bleeding site.  There also was another lesion superior to it  that look like the skin was getting thin like it might bleed soon.  Skin:    General: Skin is warm and dry.  Neurological:     General: No focal deficit present.     Mental Status: He is alert and oriented to person, place, and time.     Cranial Nerves: No cranial nerve deficit.  Psychiatric:        Mood and Affect: Mood normal.        Behavior: Behavior normal.        Thought Content: Thought content normal.    Right lower leg after cautery      ED Results / Procedures / Treatments   Labs (all labs ordered are listed, but only abnormal results are displayed) Labs Reviewed - No data to display  EKG None  Radiology No results found.  Procedures Procedures (including critical care time)  Medications Ordered in ED Medications  silver nitrate applicators applicator 1 application (1 application Topical Given 01/14/20 0030)  Thrombi-Pad 3"X3" pad 1 each (1 each Topical Given 01/14/20 0304)  tranexamic acid (CYKLOKAPRON) 1000 MG/10ML topical solution 500 mg (500 mg Topical Given 01/14/20 0304)    ED Course  I have reviewed the triage vital signs and the nursing notes.  Pertinent labs & imaging results that were available during my care of the patient were reviewed by me and considered in my medical decision making (see chart for details).    MDM Rules/Calculators/A&P                         During my initial visit I used a silver nitrate stick to cauterize the bleeding area and the other area that was more superior..  It was doing well.  Patient was wanting to walk around.  2:30 AM patient walked around and it started bleeding again.  The lower lesion is the one that was bleeding and it was squirting blood.  TXA was placed on a 2 x 2 and placed over the area however it continued to bleed, 2 small squares of thrombin pad were placed over the area and I semipressure wrap was placed with folded 4 x 4's and wrapped.   Recheck at 3:30 AM patient has been ambulatory and there is  no blood seen coming through the dressing.  He was discharged home.  We have discussed he should probably be seen to see if he should have his varicose veins removed.  He states he is going to talk to his cardiologist later today.  Final Clinical Impression(s) / ED Diagnoses Final diagnoses:  Bleeding from varicose veins of lower  extremity, right    Rx / DC Orders ED Discharge Orders    None     Plan discharge  Devoria Albe, MD, Concha Pyo, MD 01/14/20 [id number]

## 2020-01-14 NOTE — Progress Notes (Signed)
He should be seen at [facility], can see Dr. Myra Gianotti.  He will likely need some veins stripped.

## 2020-01-14 NOTE — Discharge Instructions (Addendum)
Leave the dressing in place for the next couple of days.  If it bleeds again you can use the Omni pad that we used tonight while holding pressure to get it to stop bleeding.  You should consider getting your varicose veins treated.

## 2020-01-15 ENCOUNTER — Other Ambulatory Visit (HOSPITAL_COMMUNITY): Payer: Self-pay | Admitting: *Deleted

## 2020-01-15 NOTE — Progress Notes (Signed)
Referral placed. Left detailed message on patients VM.

## 2020-01-15 NOTE — Addendum Note (Signed)
Encounter addended by: Modesta Messing, CMA on: 01/15/2020 8:31 AM  Actions taken: Visit diagnoses modified, Order list changed, Diagnosis association updated

## 2020-01-17 ENCOUNTER — Other Ambulatory Visit: Payer: Self-pay

## 2020-01-17 ENCOUNTER — Ambulatory Visit: Payer: BC Managed Care – PPO | Admitting: Physician Assistant

## 2020-01-17 ENCOUNTER — Other Ambulatory Visit: Payer: Self-pay | Admitting: *Deleted

## 2020-01-17 ENCOUNTER — Ambulatory Visit (HOSPITAL_COMMUNITY)
Admission: RE | Admit: 2020-01-17 | Discharge: 2020-01-17 | Disposition: A | Discharge status: Home / Self Care | Payer: BC Managed Care – PPO | Source: Ambulatory Visit | Attending: Vascular Surgery | Admitting: Vascular Surgery

## 2020-01-17 VITALS — BP 116/74 | HR 74 | Temp 98.2°F | Resp 20 | Ht 79.0 in | Wt 372.5 lb

## 2020-01-17 DIAGNOSIS — M25562 Pain in left knee: Secondary | ICD-10-CM

## 2020-01-17 DIAGNOSIS — M25561 Pain in right knee: Secondary | ICD-10-CM | POA: Insufficient documentation

## 2020-01-17 DIAGNOSIS — I83891 Varicose veins of right lower extremities with other complications: Secondary | ICD-10-CM
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
## 2020-01-23 ENCOUNTER — Ambulatory Visit (INDEPENDENT_AMBULATORY_CARE_PROVIDER_SITE_OTHER): Payer: BC Managed Care – PPO | Admitting: Vascular Surgery

## 2020-01-23 ENCOUNTER — Encounter: Payer: Self-pay | Admitting: Vascular Surgery

## 2020-01-23 ENCOUNTER — Other Ambulatory Visit: Payer: Self-pay

## 2020-01-23 VITALS — BP 122/79 | HR 82 | Temp 98.0°F | Resp 16 | Ht 79.0 in | Wt 370.9 lb

## 2020-01-23 DIAGNOSIS — I83891 Varicose veins of right lower extremities with other complications: Secondary | ICD-10-CM | POA: Diagnosis not present
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
## 2020-01-23 NOTE — Addendum Note (Signed)
Addended by: Chuck Hint on: 01/23/2020 04:01 PM   Modules accepted: Level of Service

## 2020-01-24 ENCOUNTER — Encounter: Payer: Self-pay | Admitting: Vascular Surgery

## 2020-01-28 ENCOUNTER — Other Ambulatory Visit: Payer: Self-pay | Admitting: *Deleted

## 2020-01-28 ENCOUNTER — Telehealth: Payer: Self-pay | Admitting: *Deleted

## 2020-01-28 DIAGNOSIS — F411 Generalized anxiety disorder: Secondary | ICD-10-CM

## 2020-01-28 DIAGNOSIS — I83891 Varicose veins of right lower extremities with other complications: Secondary | ICD-10-CM

## 2020-01-28 MED ORDER — LORAZEPAM 1 MG PO TABS: ORAL_TABLET | ORAL | 0 refills | Status: DC

## 2020-01-28 NOTE — Telephone Encounter (Signed)
Spoke with Harlene Ramus (pharmacist [facility] [place], [place]) and ordered Ativan 1 mg 2 tablets for Mr. Ethan York.  Instructed him by phone to take Ativan 1 mg by mouth 30 minutes before leaving his house on the day of the procedure and to bring the second tablet with him on the day of the procedure.  Mr. Ethan York verbalized understanding.

## 2020-01-30 ENCOUNTER — Encounter: Payer: Self-pay | Admitting: Vascular Surgery

## 2020-01-30 ENCOUNTER — Telehealth: Payer: Self-pay | Admitting: *Deleted

## 2020-01-30 ENCOUNTER — Ambulatory Visit: Payer: BC Managed Care – PPO | Admitting: Vascular Surgery

## 2020-01-30 ENCOUNTER — Other Ambulatory Visit: Payer: Self-pay

## 2020-01-30 VITALS — BP 112/62 | HR 76 | Temp 97.9°F | Resp 18 | Ht 79.0 in | Wt 370.9 lb

## 2020-01-30 DIAGNOSIS — I83891 Varicose veins of right lower extremities with other complications: Secondary | ICD-10-CM

## 2020-01-30 DIAGNOSIS — I83811 Varicose veins of right lower extremities with pain: Secondary | ICD-10-CM

## 2020-01-30 HISTORY — PX: ENDOVENOUS ABLATION SAPHENOUS VEIN W/ LASER: SUR449

## 2020-01-30 NOTE — Telephone Encounter (Signed)
Returning Mr. Molinelli's  telephone voice message.  Mr. Reason had an endovenous laser ablation right greater saphenous vein today by Dr. Cari Caraway.  Mr. Gille states his right leg (thigh) was cramping about an hour ago.  Reassured Mr. Loescher that cramping is muscular in nature and not related to varicose vein procedure.  He states the cramping has passed and has not reoccurred.   Encouraged him to walk, hydrate by drinking water, and eat potassium rich foods.

## 2020-01-30 NOTE — Progress Notes (Signed)
Patient name: Ethan York MRN: [id number] DOB: 12-14-1975 Sex: male  REASON FOR VISIT: For laser ablation right great saphenous vein  HPI: Ethan York is a 44 y.o. male who I saw on 01/23/2020 with bleeding varicose veins of the right lower extremity.  He had symptomatic venous insufficiency and had failed conservative treatment.  He presents for laser ablation of the right great saphenous vein.  On exam he had hyperpigmentation and Corona phlebectatica of the right leg where he had the bleeding.  I did look at the right great saphenous vein myself with the SonoSite and the vein was significantly dilated in the proximal calf and distal thigh.  The vein did narrow down some in the mid thigh but was dilated in the proximal thigh.  I thought we could cannulate the vein in the proximal third of the calf.  If the wire would not advance through the narrowed segment we would cannulate above this area in the thigh.  Current Outpatient Medications  Medication Sig Dispense Refill  . Aspirin-Acetaminophen (GOODY BODY PAIN) 500-325 MG PACK Take 1 packet by mouth daily as needed (pain).     . carvedilol (COREG) 25 MG tablet TAKE 1 TABLET BY MOUTH TWICE A DAY 180 tablet 3  . ENTRESTO 97-103 MG TAKE 1 TABLET BY MOUTH TWICE A DAY 60 tablet 6  . hydrochlorothiazide (HYDRODIURIL) 25 MG tablet TAKE 1 TABLET BY MOUTH EVERY DAY 90 tablet 3  . LORazepam (ATIVAN) 1 MG tablet Take 1 tablet 30 minutes prior to leaving house on day of procedure.  Bring second tablet in with you to office on day of procedure. 2 tablet 0  . LORazepam (ATIVAN) 1 MG tablet Take 1 tablet 30 minutes prior to leaving house on day of procedure.  Bring second tablet with you to office on day of procedure. 2 tablet 0  . multivitamin (ONE-A-DAY MEN'S) TABS tablet Take 1 tablet by mouth daily.    Marland Kitchen spironolactone (ALDACTONE) 25 MG tablet TAKE 1 TABLET BY MOUTH EVERY DAY 90 tablet 1   No current facility-administered medications for this visit.     PHYSICAL EXAM: Vitals:   01/30/20 0838  BP: 112/62  Pulse: 76  Resp: 18  Temp: 97.9 F (36.6 C)  TempSrc: Temporal  SpO2: 98%  Weight: (!) 370 lb 14.4 oz (168.2 kg)  Height: 6\' 7"  (2.007 m)    PROCEDURE: Endovenous laser ablation right great saphenous vein  TECHNIQUE: Patient was taken to the exam room.  Of note he is 6 feet 7 inches tall so we positioned carefully on the table.  I looked at the great saphenous vein myself with the SonoSite.  Again I noted that the vein narrowed down in the thigh and therefore I felt we would likely have to cannulate in 2 separate areas.  The right leg was prepped and draped in usual sterile fashion.  Under ultrasound guidance, after the skin was anesthetized, I cannulated the great saphenous vein in the proximal calf and a micropuncture sheath was introduced over the micropuncture wire.  I then advanced the straight end of the wire up to where I was concerned the vein had narrowed significantly.  The wire would not pass.  I therefore elected to address this area and then cannulate above this.  I then advanced the 65 cm sheath over the wire to the this area and then the wire and dilator were removed.  I then positioned the laser fiber at the end of the sheath and then  retracted the sheath.  Tumescent anesthesia was administered circumferentially around the vein from the proximal calf to the junction of the proximal and mid thigh.  Once this was done the patient was placed in Trendelenburg.  Using laser precautions including laser safety glasses laser ablation was performed of the great saphenous vein from the junction of the proximal and mid thigh to the proximal calf.  24 cm was treated with 1157 J.  Next I then identified the great saphenous vein above this area of narrowing where there were multiple branches coming off.  I was able to cannulate this under ultrasound guidance after the skin was anesthetized and a micropuncture sheath was introduced here.  I  then advanced the J-wire to approximately 3 cm distal to the saphenofemoral junction.  The sheath and dilator was advanced over this and then the wire and dilator removed.  Then positioned the laser fiber at the end of the sheath and retracted the sheath.  I then carefully positioned the end of the laser fiber approximately 2.5 cm distal to the saphenofemoral junction.  Tumescent anesthesia was again administered and I treated this 8 cm of vein with 265 J of energy.  Sterile dressing was applied.  Patient tolerated the procedure well was transferred to the recovery room in stable condition.  Waverly Ferrari [facility] [place] [phone]

## 2020-01-30 NOTE — Progress Notes (Signed)
     Laser Ablation Procedure    Date: 01/30/2020   Zen Cedillos DOB:1975/11/22  Consent signed: Yes     Surgeon: Cari Caraway MD   Procedure: Laser Ablation: right Greater Saphenous Vein  BP 112/62 (BP Location: Left Arm, Patient Position: Sitting, Cuff Size: Large)   Pulse 76   Temp 97.9 F (36.6 C) (Temporal)   Resp 18   Ht 6\' 7"  (2.007 m)   Wt (!) 370 lb 14.4 oz (168.2 kg)   SpO2 98%   BMI 41.78 kg/m   Tumescent Anesthesia: 450 cc 0.9% NaCl with 50 cc Lidocaine HCL 1%  and 15 cc 8.4% NaHCO3  Local Anesthesia: 7 cc Lidocaine HCL and NaHCO3 (ratio 2:1)  7 watts continuous mode     Total energy: #1 1157 Joules   #2   265 Joules  Total time:  # 1  165 seconds    #2   37 seconds  Treatment Length  #1  24 cm    #2   8 cm    Laser Fiber Ref.     Lot # [id number]     Patient tolerated procedure well  Notes: Patient wore face mask.  All staff members wore facial masks and facial shields/goggles. Mr. Winograd took Ativan 1 mg on 01-30-2020 at 7:30 AM and at 8:25 AM.     Description of Procedure:  After marking the course of the secondary varicosities, the patient was placed on the operating table in the supine position, and the right leg was prepped and draped in sterile fashion.   Local anesthetic was administered and under ultrasound guidance the saphenous vein was accessed with a micro needle and guide wire; then the mirco puncture sheath was placed.  A guide wire was inserted saphenofemoral junction , followed by a 5 french sheath.  The position of the sheath and then the laser fiber below the junction was confirmed using the ultrasound.  Tumescent anesthesia was administered along the course of the saphenous vein using ultrasound guidance. The patient was placed in Trendelenburg position and protective laser glasses were placed on patient and staff, and the laser was fired at 7 watts continuous mode for a total of # 1  1157 joules and #2  265 joules.        Steri  strip was applied to the IV insertion site and ABD pads and thigh high compression stockings were applied.  Ace wrap bandages were applied over the right thigh and at the top of the saphenofemoral junction. Blood loss was less than 15 cc.  Discharge instructions reviewed with patient and hardcopy of discharge instructions given to patient to take home. The patient ambulated out of the operating room having tolerated the procedure well.

## 2020-01-31 ENCOUNTER — Other Ambulatory Visit (HOSPITAL_COMMUNITY): Payer: Self-pay | Admitting: Cardiology

## 2020-02-06 ENCOUNTER — Ambulatory Visit (INDEPENDENT_AMBULATORY_CARE_PROVIDER_SITE_OTHER): Payer: BC Managed Care – PPO | Admitting: Vascular Surgery

## 2020-02-06 ENCOUNTER — Ambulatory Visit (HOSPITAL_COMMUNITY)
Admission: RE | Admit: 2020-02-06 | Discharge: 2020-02-06 | Disposition: A | Discharge status: Home / Self Care | Payer: BC Managed Care – PPO | Source: Ambulatory Visit | Attending: Vascular Surgery | Admitting: Vascular Surgery

## 2020-02-06 ENCOUNTER — Other Ambulatory Visit: Payer: Self-pay

## 2020-02-06 ENCOUNTER — Encounter: Payer: Self-pay | Admitting: Vascular Surgery

## 2020-02-06 VITALS — BP 108/66 | HR 76 | Temp 98.1°F | Resp 18 | Ht 79.0 in | Wt 370.0 lb

## 2020-02-06 DIAGNOSIS — I83891 Varicose veins of right lower extremities with other complications: Secondary | ICD-10-CM | POA: Insufficient documentation

## 2020-02-06 DIAGNOSIS — I83811 Varicose veins of right lower extremities with pain: Secondary | ICD-10-CM

## 2020-02-06 NOTE — Progress Notes (Signed)
Patient name: Ethan York MRN: [id number] DOB: 1976/01/13 Sex: male  REASON FOR VISIT: Follow-up after laser ablation of the right great saphenous vein  HPI: Ethan York is a 44 y.o. male who had presented with bleeding varicose veins of the right lower extremity.  He was found to have reflux in the right great saphenous vein.  He had significant reflux and was felt to be a good candidate for laser ablation.  He underwent laser ablation of the right great saphenous vein from the proximal calf to the saphenofemoral junction.  He comes in for a 1 week follow-up visit.  He has some pain in the proximal calf likely related to phlebitis from his ablation procedure.  The incisions look fine.  There is no significant bruising.  He denies any chest pain or shortness of breath.  CONSERVATIVE TREATMENT: He will continue to wear his thigh-high stocking for another week.  I have also encouraged him to continue to elevate his legs.   Current Outpatient Medications  Medication Sig Dispense Refill  . Aspirin-Acetaminophen (GOODY BODY PAIN) 500-325 MG PACK Take 1 packet by mouth daily as needed (pain).     . carvedilol (COREG) 25 MG tablet TAKE 1 TABLET BY MOUTH TWICE A DAY 180 tablet 3  . ENTRESTO 97-103 MG TAKE 1 TABLET BY MOUTH TWICE A DAY 60 tablet 6  . hydrochlorothiazide (HYDRODIURIL) 25 MG tablet TAKE 1 TABLET BY MOUTH EVERY DAY 90 tablet 3  . LORazepam (ATIVAN) 1 MG tablet Take 1 tablet 30 minutes prior to leaving house on day of procedure.  Bring second tablet in with you to office on day of procedure. 2 tablet 0  . LORazepam (ATIVAN) 1 MG tablet Take 1 tablet 30 minutes prior to leaving house on day of procedure.  Bring second tablet with you to office on day of procedure. 2 tablet 0  . multivitamin (ONE-A-DAY MEN'S) TABS tablet Take 1 tablet by mouth daily.    Marland Kitchen spironolactone (ALDACTONE) 25 MG tablet TAKE 1 TABLET BY MOUTH EVERY DAY 90 tablet 1   No current facility-administered  medications for this visit.   REVIEW OF SYSTEMS: Arly.Keller ] denotes positive finding; [  ] denotes negative finding  CARDIOVASCULAR:  [ ]  chest pain   [ ]  dyspnea on exertion  [ ]  leg swelling  CONSTITUTIONAL:  [ ]  fever   [ ]  chills  PHYSICAL EXAM: Vitals:   02/06/20 1526  BP: 108/66  Pulse: 76  Resp: 18  Temp: 98.1 F (36.7 C)  TempSrc: Temporal  SpO2: 96%  Weight: (!) 370 lb (167.8 kg)  Height: 6\' 7"  (2.007 m)   GENERAL: The patient is a well-nourished male, in no acute distress. The vital signs are documented above. CARDIOVASCULAR: There is a regular rate and rhythm. PULMONARY: There is good air exchange bilaterally without wheezing or rales. VASCULAR: He has no significant swelling or bruising in the right thigh.  His incisions are healing nicely.  DATA:  VENOUS DUPLEX: I have independently interpreted his venous duplex scan today.  There is no evidence of DVT in the right leg.  The great saphenous vein is successfully closed from 2.9 cm distal to the saphenofemoral junction to the proximal calf.  MEDICAL ISSUES:  STATUS POST LASER ABLATION RIGHT GREAT SAPHENOUS VEIN: The patient is doing well status post laser ablation of the right great saphenous vein.  He will continue to wear his thigh-high stocking for another week.  He can gradually resume his normal activities. Given  his episode of bleeding related to his varicosities he will need sclerotherapy at the site of bleeding and we will try to arrange this in the near future.  Waverly Ferrari [facility] [place] [phone]

## 2020-02-10 ENCOUNTER — Other Ambulatory Visit (HOSPITAL_COMMUNITY): Payer: Self-pay | Admitting: Cardiology

## 2020-02-25 ENCOUNTER — Ambulatory Visit: Payer: BC Managed Care – PPO

## 2020-02-25 ENCOUNTER — Other Ambulatory Visit: Payer: Self-pay

## 2020-02-25 DIAGNOSIS — I8393 Asymptomatic varicose veins of bilateral lower extremities: Secondary | ICD-10-CM | POA: Diagnosis not present

## 2020-02-25 NOTE — Progress Notes (Signed)
Treated pt's R leg widespread reticular veins with Aslcera 1% administered with a 27g butterfly.  Patient received a total of 4 mL. Pt tolerated well. Pt needs further treatment, as these veins are large and widespread. Provided post procedure care instructions on both handout and verbal. Will follow Prn.  Photos: Yes.    Compression stockings applied: Yes.

## 2020-03-11 DIAGNOSIS — Z6841 Body Mass Index (BMI) 40.0 and over, adult: Secondary | ICD-10-CM | POA: Diagnosis not present

## 2020-03-11 DIAGNOSIS — R05 Cough: Secondary | ICD-10-CM | POA: Diagnosis not present

## 2020-03-11 DIAGNOSIS — J329 Chronic sinusitis, unspecified: Secondary | ICD-10-CM | POA: Diagnosis not present

## 2020-04-07 ENCOUNTER — Ambulatory Visit (INDEPENDENT_AMBULATORY_CARE_PROVIDER_SITE_OTHER): Payer: BC Managed Care – PPO | Admitting: *Deleted

## 2020-04-07 DIAGNOSIS — I5022 Chronic systolic (congestive) heart failure: Secondary | ICD-10-CM | POA: Diagnosis not present

## 2020-04-07 LAB — CUP PACEART REMOTE DEVICE CHECK
Battery Remaining Longevity: 37 mo
Battery Remaining Percentage: 42 %
Battery Voltage: 2.92 V
Brady Statistic AP VP Percent: 5.7 %
Brady Statistic AP VS Percent: 1 %
Brady Statistic AS VP Percent: 94 %
Brady Statistic AS VS Percent: 1 %
Brady Statistic RA Percent Paced: 5.4 %
Date Time Interrogation Session: 20210907015042
HighPow Impedance: 100 Ohm
HighPow Impedance: 100 Ohm
Implantable Lead Implant Date: 20170512
Implantable Lead Implant Date: 20170512
Implantable Lead Implant Date: 20170512
Implantable Lead Location: 753858
Implantable Lead Location: 753859
Implantable Lead Location: 753860
Implantable Lead Model: 7122
Implantable Pulse Generator Implant Date: 20170512
Lead Channel Impedance Value: 1225 Ohm
Lead Channel Impedance Value: 460 Ohm
Lead Channel Impedance Value: 610 Ohm
Lead Channel Pacing Threshold Amplitude: 1 V
Lead Channel Pacing Threshold Amplitude: 1.25 V
Lead Channel Pacing Threshold Amplitude: 1.5 V
Lead Channel Pacing Threshold Pulse Width: 0.5 ms
Lead Channel Pacing Threshold Pulse Width: 0.5 ms
Lead Channel Pacing Threshold Pulse Width: 1 ms
Lead Channel Sensing Intrinsic Amplitude: 12 mV
Lead Channel Sensing Intrinsic Amplitude: 5 mV
Lead Channel Setting Pacing Amplitude: 2 V
Lead Channel Setting Pacing Amplitude: 2.25 V
Lead Channel Setting Pacing Amplitude: 2.5 V
Lead Channel Setting Pacing Pulse Width: 0.5 ms
Lead Channel Setting Pacing Pulse Width: 1 ms
Lead Channel Setting Sensing Sensitivity: 0.5 mV
Pulse Gen Serial Number: 7355027

## 2020-04-09 IMAGING — CT CT ANGIO CHEST
2 of 5 series · 18 of 46 positions shown · IV contrast (Omni 300)
Comparison: None.

CLINICAL DATA: Chronic Systolic CHF. Pt had nausea and vomiting
after the injection, felt better after vomiting and no other
reaction.

EXAM:
CT ANGIOGRAPHY CHEST WITH CONTRAST
TECHNIQUE: Multidetector CT imaging of the chest was performed using the
standard protocol during bolus administration of intravenous
contrast. Multiplanar CT image reconstructions and MIPs were
obtained to evaluate the vascular anatomy.
CONTRAST:  <See Chart> AAY142-2H3 IOPAMIDOL (AAY142-2H3) INJECTION
76%

[Series 5: thoracic cta 2mm · axial · 0.94mm/px · z∈[+1194,+1498]mm · 15 of 169 slices shown]
[im 11/169  lung]
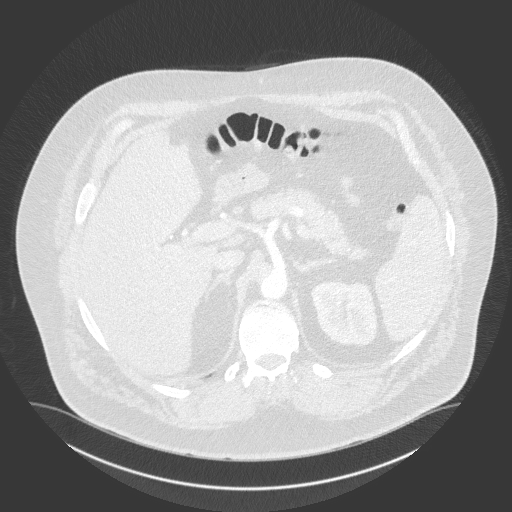
[im 22/169  soft-tissue]
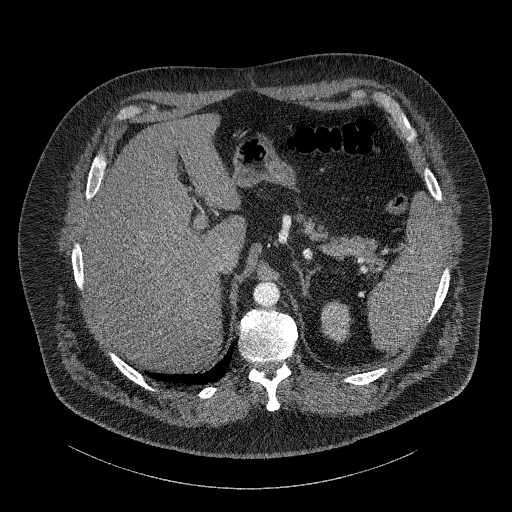
[im 33/169  lung]
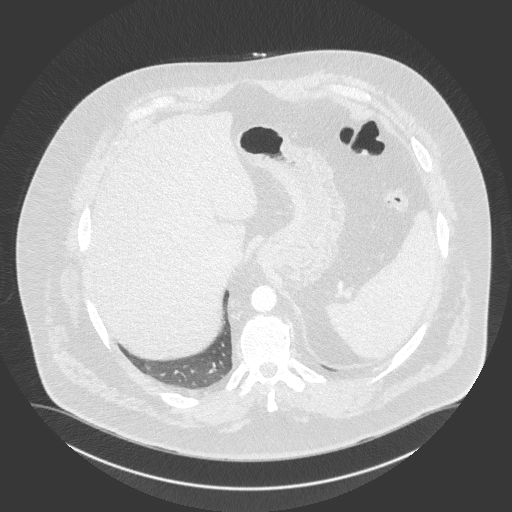
[im 44/169  soft-tissue]
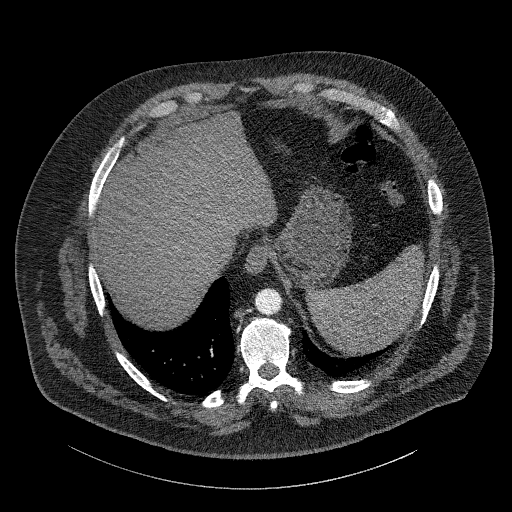
[im 55/169  lung]
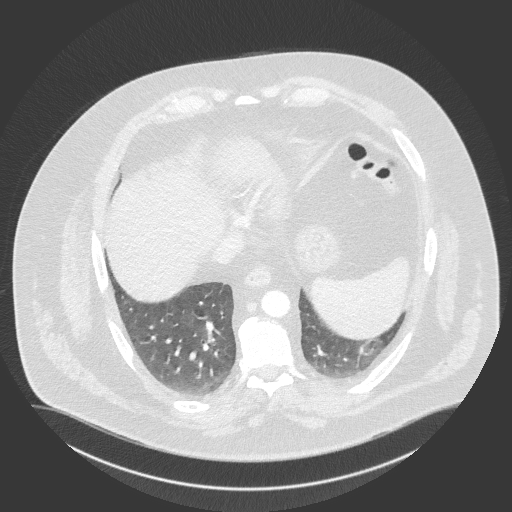
[im 66/169  soft-tissue]
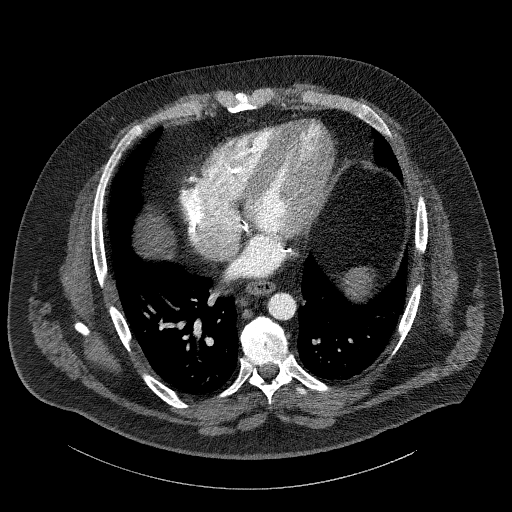
[im 76/169  lung]
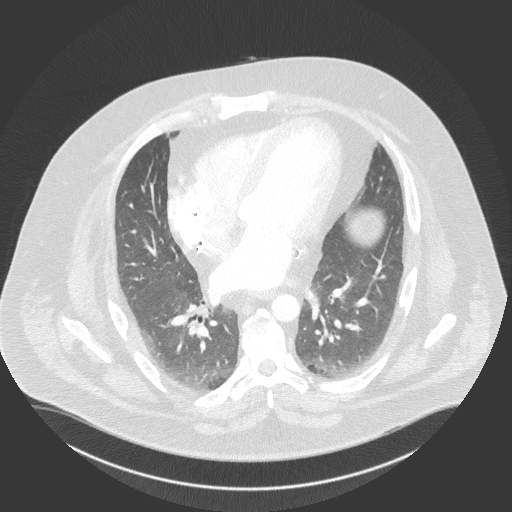
[im 87/169  soft-tissue]
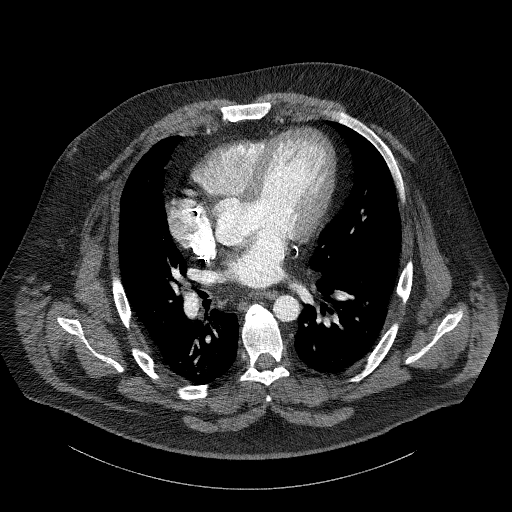
[im 98/169  lung]
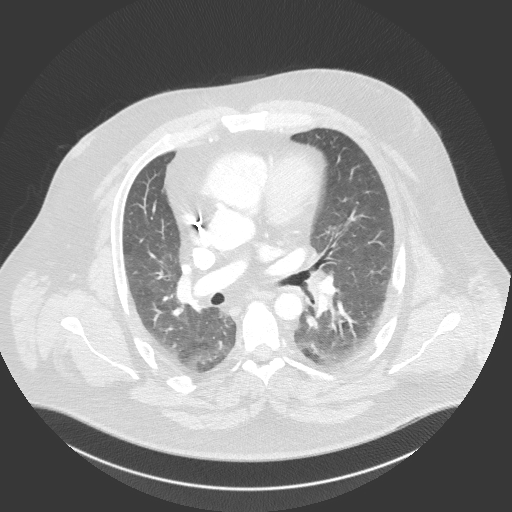
[im 109/169  soft-tissue]
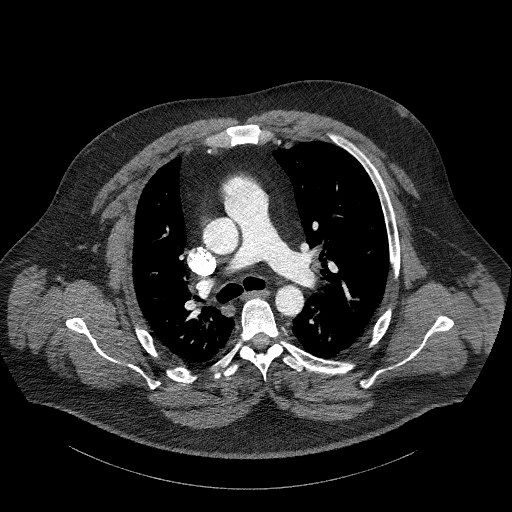
[im 120/169  lung]
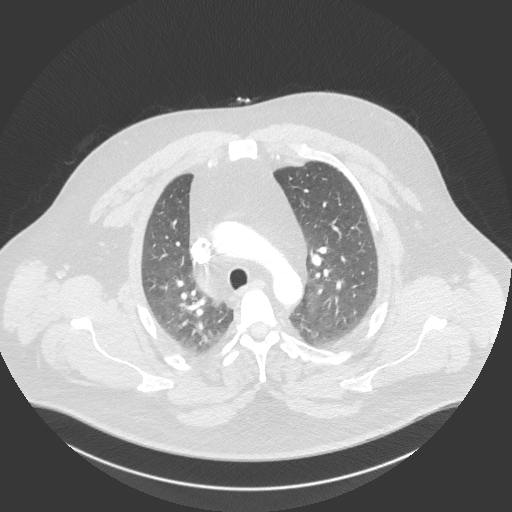
[im 131/169  soft-tissue]
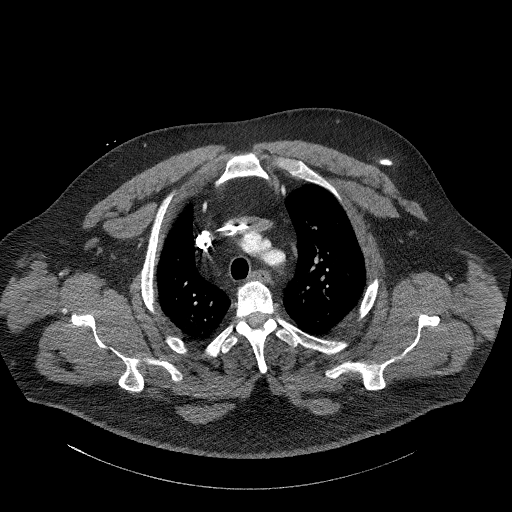
[im 141/169  lung]
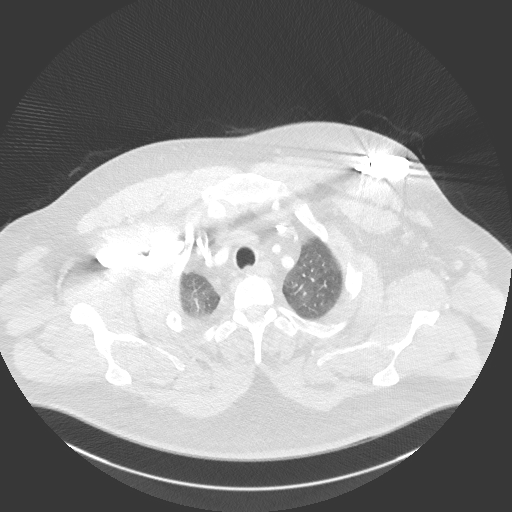
[im 152/169  soft-tissue]
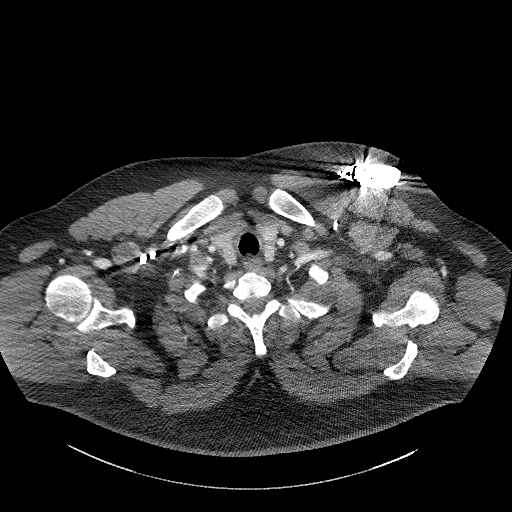
[im 163/169  lung]
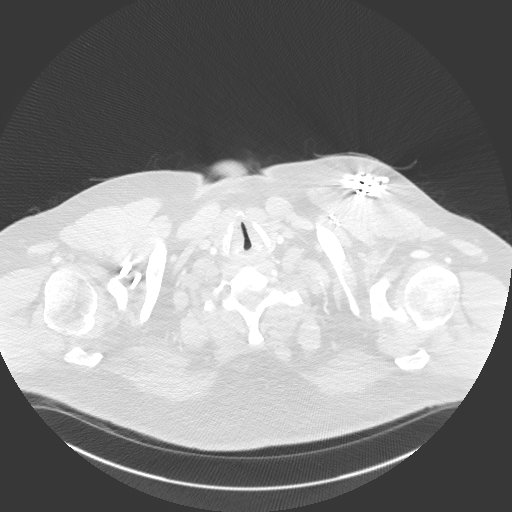

[Series 8: thoracic cta 2mm cor · coronal · 0.66mm/px · 3 of 175 slices shown]
[im 44/175  soft-tissue]
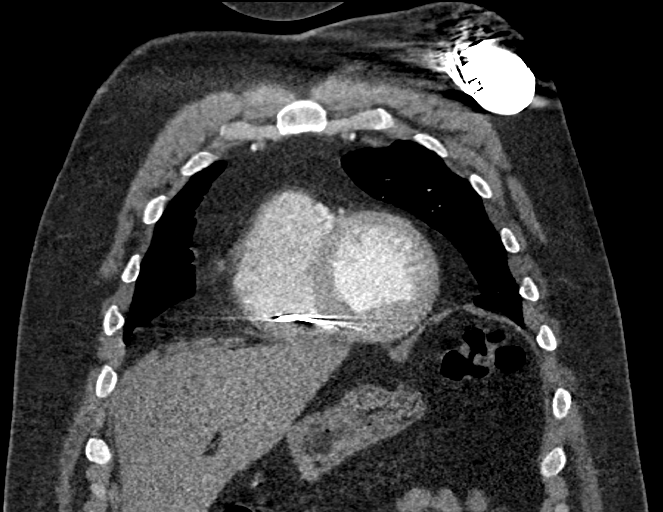
[im 88/175  soft-tissue]
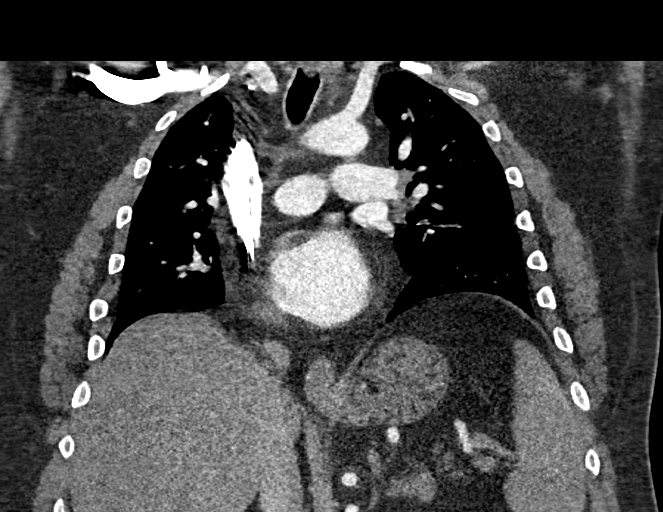
[im 131/175  soft-tissue]
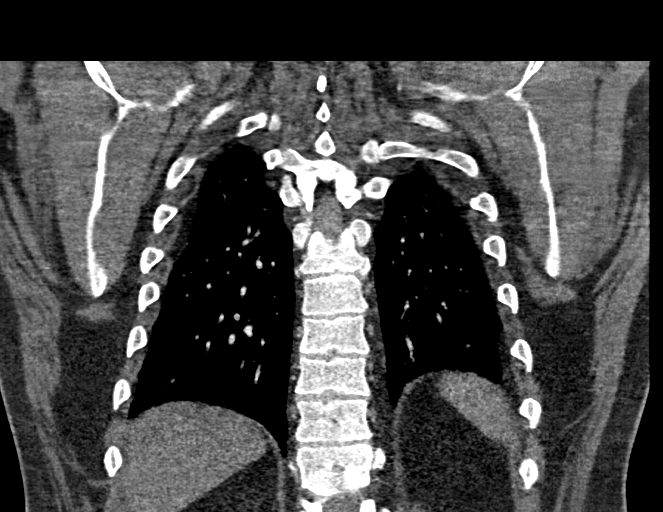

[18 of 46 positions shown; findings below may reference images not displayed]

FINDINGS: Cardiovascular: Left subclavian transvenous pacing leads extend into
the right atrium, coronary sinus, and towards the right ventricular
apex. Heart size upper limits normal. No pericardial effusion. The
RV is nondilated. Satisfactory opacification of pulmonary arteries
noted, and there is no evidence of pulmonary emboli. Some image
degradation due to continued breathing motion during the
acquisition. Adequate contrast opacification of the thoracic aorta
with no evidence of dissection, aneurysm, or stenosis. There is
classic 3-vessel brachiocephalic arch anatomy without proximal
stenosis. No significant atheromatous change.

Mediastinum/Nodes: No hilar or mediastinal adenopathy.

Lungs/Pleura: No pleural effusion. Lungs are clear. Dependent
ground-glass opacities probably atelectasis posteriorly in both
lungs.

Musculoskeletal: Anterior vertebral endplate spurring at multiple
levels in the lower thoracic spine. No fracture or worrisome bone
lesion.

Upper Abdomen: No acute abnormality.

Review of the MIP images confirms the above findings.
IMPRESSION: Negative for acute PE or thoracic aortic dissection.

## 2020-04-09 NOTE — Progress Notes (Signed)
Remote ICD transmission.   

## 2020-04-27 ENCOUNTER — Encounter (HOSPITAL_COMMUNITY): Payer: Self-pay | Admitting: Cardiology

## 2020-04-27 ENCOUNTER — Other Ambulatory Visit: Payer: Self-pay

## 2020-04-27 ENCOUNTER — Ambulatory Visit (HOSPITAL_COMMUNITY)
Admission: RE | Admit: 2020-04-27 | Discharge: 2020-04-27 | Disposition: A | Discharge status: Home / Self Care | Payer: BC Managed Care – PPO | Source: Ambulatory Visit | Attending: Cardiology | Admitting: Cardiology

## 2020-04-27 VITALS — BP 118/78 | HR 79 | Wt 378.2 lb

## 2020-04-27 DIAGNOSIS — I5022 Chronic systolic (congestive) heart failure: Secondary | ICD-10-CM | POA: Diagnosis not present

## 2020-04-27 DIAGNOSIS — I428 Other cardiomyopathies: Secondary | ICD-10-CM | POA: Insufficient documentation

## 2020-04-27 DIAGNOSIS — Z79899 Other long term (current) drug therapy: Secondary | ICD-10-CM | POA: Insufficient documentation

## 2020-04-27 DIAGNOSIS — E669 Obesity, unspecified: Secondary | ICD-10-CM | POA: Diagnosis not present

## 2020-04-27 DIAGNOSIS — G4733 Obstructive sleep apnea (adult) (pediatric): Secondary | ICD-10-CM | POA: Insufficient documentation

## 2020-04-27 DIAGNOSIS — I110 Hypertensive heart disease with heart failure: Secondary | ICD-10-CM | POA: Diagnosis present

## 2020-04-27 DIAGNOSIS — Z6841 Body Mass Index (BMI) 40.0 and over, adult: Secondary | ICD-10-CM | POA: Diagnosis not present

## 2020-04-27 DIAGNOSIS — Z8249 Family history of ischemic heart disease and other diseases of the circulatory system: Secondary | ICD-10-CM | POA: Insufficient documentation

## 2020-04-27 DIAGNOSIS — F17210 Nicotine dependence, cigarettes, uncomplicated: Secondary | ICD-10-CM | POA: Diagnosis not present

## 2020-04-27 LAB — LIPID PANEL
Cholesterol: 164 mg/dL (ref 0–200)
HDL: 33 mg/dL — ABNORMAL LOW (ref >40)
LDL Cholesterol: 110 mg/dL — ABNORMAL HIGH (ref 0–99)
Total CHOL/HDL Ratio: 5 RATIO
Triglycerides: 105 mg/dL (ref <150)
VLDL: 21 mg/dL (ref 0–40)

## 2020-04-27 LAB — BASIC METABOLIC PANEL
Anion gap: 9 (ref 5–15)
BUN: 11 mg/dL (ref 6–20)
CO2: 25 mmol/L (ref 22–32)
Calcium: 9.3 mg/dL (ref 8.9–10.3)
Chloride: 102 mmol/L (ref 98–111)
Creatinine, Ser: 0.97 mg/dL (ref 0.61–1.24)
GFR calc Af Amer: >60 mL/min (ref >60)
GFR calc non Af Amer: >60 mL/min (ref >60)
Glucose, Bld: 109 mg/dL — ABNORMAL HIGH (ref 70–99)
Potassium: 3.9 mmol/L (ref 3.5–5.1)
Sodium: 136 mmol/L (ref 135–145)

## 2020-04-27 MED ORDER — CARVEDILOL 25 MG PO TABS
25.0000 mg | ORAL_TABLET | Freq: Two times a day (BID) | ORAL | 3 refills | Status: DC
Start: 2020-04-27 — End: 2021-10-07

## 2020-04-27 MED ORDER — FUROSEMIDE 20 MG PO TABS: 20.0000 mg | ORAL_TABLET | Freq: Every day | ORAL | 6 refills | Status: DC | PRN

## 2020-04-27 MED ORDER — BUPROPION HCL ER (SR) 150 MG PO TB12: ORAL_TABLET | ORAL | 1 refills | Status: DC

## 2020-04-27 NOTE — Progress Notes (Signed)
Patient ID: Ethan York, male   DOB: Jan 10, 1976, 44 y.o.   MRN: [id number]  PCP: Dr. Tommi Rumps [facility]) Cardiology: Dr. Shirlee Latch  44 y.o. with history of HTN and OSA was admitted to [facility] in 11/16 with several weeks of progressive dyspnea and was found to have acute systolic CHF with EF 20-25% by echo.  No chest pain.  He was diuresed and had left and right heart cath, showing preserved cardiac output with elevated filling pressures and no significant CAD.  Echo 3/17 with persistently decreased EF, 25%.  CPX 4/17 with moderate functional limitation due to heart failure.  He had St Jude CRT-D placed in 5/17.  Echo 10/18 showed EF 30-35% with diffuse hypokinesis.  Echo in 10/19 showed EF 50-55%, aortic root 4.4 cm. CTA chest in 1/20 showed no ascending aorta or root aneurysm. Echo in 11/20 with EF up to 55-60%.   He returns for followup of CHF.  Weight is up 11 lbs, he thinks this is due to poor diet.  He and his wife just started a low carb diet last week.  He is still smoking about 1 ppd.  No significant exertional dyspnea or chest pain.  No orthopnea/PND.  No lightheadedness/syncope.  No palpitations.   He had recent vein stripping in his right leg.   St Jude device interrogation: >99% BiV pacing, no AF/VT, stable thoracic impedance.   Labs (11/16): K 3.3, creatinine 1.19, LDL 84, BNP 884, TSH normal Labs (12/16): K 4.3, creatinine 1.06, BNP 123 Labs (07/2015): K 4.8 Creatinine 1.04  Labs (09/2015): Creatinine 1.26  Labs (5/17): K 4.1, creatinine 0.96 Labs (9/18): TSH normal, K 3.8, creatihine 1.03, hgb 15.4 Labs (9/19): K 4.4, creatinine 1.05 => 1.02 => 0.9, hgb 14.3, LDL 95 Labs (1/20): LDL 95 Labs (10/20): LDL 94, TGs 172, K 3.6, creatinine 1.08  PMH: 1. HTN 2. OSA: Using CPAP  3. GERD 4. Chronic systolic CHF: Nonischemic cardiomyopathy.   - Echo (11/16) with EF 20-25%, diffuse hypokinesis, mild-moderate MR, PASP 35 mmHg, severe LAE.  - LHC/RHC (11/16) with no significant CAD; mean  RA 9, PA 50/29 mean 37, mean PCWP 22, CI 2.47, PVR 2.13, LVEDP 30.  - Unable to fit for cardiac MRI. - Echo (3/17) with EF 25%, septal-lateral dyssynchrony, mild to moderately dilated LV, normal RV size and systolic function.  - CPX (4/17): peak VO2 16.6 (54% predicted), VE/VCO2 slope 25, RER 1.13 => moderate HF limitation.  - 5/17 St Jude CRT-D placed.  - Echo (10/18): EF 30-35%, diffuse hypokinesis, normal RV size and systolic function.  - Echo (10/19): EF 50-55%, aortic root 4.4 cm.  - CPX (10/19): Peak VO2 16.5, VE/CO2 26, RER 1.15 => moderate functional impairment, likely due to weight/body habitus.   - Echo (11/20): EF 55-60%, normal RV.  5. LBBB 6. Active smoker 7. Dilated aortic root: 4.4 cm on 10/19 echo.  - CTA chest (1/20) did not show dilated root or ascending aorta.  8. Venous insufficiency.   FH: - Brother with MI at 61 - Mother with MI in her 52s - No FH of cardiomyopathy.   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  He smoked 2.5 ppd, now down to 4 cigs/day.  Occasional ETOH.   ROS: All systems reviewed and negative except as per HPI.   Current Outpatient Medications  Medication Sig Dispense Refill  . Aspirin-Acetaminophen (GOODY BODY PAIN) 500-325 MG PACK Take 1 packet  by mouth daily as needed (pain).     . carvedilol (COREG) 25 MG tablet Take 1 tablet (25 mg total) by mouth 2 (two) times daily. 180 tablet 3  . ENTRESTO 97-103 MG TAKE 1 TABLET BY MOUTH TWICE A DAY 180 tablet 3  . hydrochlorothiazide (HYDRODIURIL) 25 MG tablet TAKE 1 TABLET BY MOUTH EVERY DAY 90 tablet 3  . LORazepam (ATIVAN) 1 MG tablet Take 1 tablet 30 minutes prior to leaving house on day of procedure.  Bring second tablet in with you to office on day of procedure. 2 tablet 0  . multivitamin (ONE-A-DAY MEN'S) TABS tablet Take 1 tablet by mouth daily.    Marland Kitchen spironolactone (ALDACTONE) 25 MG tablet TAKE 1 TABLET BY MOUTH EVERY DAY 90 tablet 1  .  buPROPion (WELLBUTRIN SR) 150 MG 12 hr tablet Take 1 tablet (150 mg total) by mouth daily for 3 days, THEN 1 tablet (150 mg total) 2 (two) times daily. 60 tablet 1  . furosemide (LASIX) 20 MG tablet Take 1 tablet (20 mg total) by mouth daily as needed (for severe edema). 30 tablet 6   No current facility-administered medications for this encounter.   BP 118/78   Pulse 79   Wt (!) 171.6 kg (378 lb 4 oz)   SpO2 98%   BMI 42.61 kg/m  General: NAD, obese Neck: No JVD, no thyromegaly or thyroid nodule.  Lungs: Clear to auscultation bilaterally with normal respiratory effort. CV: Nondisplaced PMI.  Heart regular S1/S2, no S3/S4, no murmur.  Trace ankle edema.  No carotid bruit.  Normal pedal pulses. Varicose veins bilaterally.  Abdomen: Soft, nontender, no hepatosplenomegaly, no distention.  Skin: Intact without lesions or rashes.  Neurologic: Alert and oriented x 3.  Psych: Normal affect. Extremities: No clubbing or cyanosis.  HEENT: Normal.   Assessment/Plan: 1. Chronic systolic CHF: Nonischemic cardiomyopathy.  EF 20-25% on 11/16 echo, repeat echo in 3/17 with EF 25%, septal-lateral dyssynchrony.  Unable to fit in MRI.  Cause of cardiomyopathy still uncertain.  No significant CAD on coronary angiography.  Has family history of CAD but not cardiomyopathy.  Possible viral myocarditis, also consider LBBB cardiomyopathy. He now has Secondary school teacher CRT-D device.  Echo 10/18 with EF 30-35%. Most recent echo in 11/20 showed EF up to 55-60%.  NYHA class I-II symptoms.  He is not volume overloaded on exam or by Corvue.  - Continue Entresto 97/103 bid.  - Continue Coreg 25 mg bid and spironolactone 25 daily.    - Echo again at followup in 1 year.   - He has Lasix 20 mg to use prn.  - BMET today and every 3 months, can do at [facility] office.  2. Smoking: Working on quitting. - Will to try Wellbutrin.  Will prescribe.  3. OSA: Continue nightly CPAP.  4. Obesity: Weight still well above goal.  We  discussed diet/exercise and weight loss strategies. He and his wife have started a diet.  5.  HTN: Continue HCTZ in addition to the above meds.   Followup in 1 year with echo  [name] 04/27/2020

## 2020-04-27 NOTE — Patient Instructions (Signed)
Take Furosemide 20 mg Daily AS NEEDED for severe swelling only  Start Wellbutrin 150 mg Daily FOR 3 DAYS, Then increase to 150 mg Twice daily, please follow up with your Primary Care Provider for further refills  Labs done today, we will call you for abnormal results  Your physician recommends that you return for lab work in: 3 months, this can be done at the [facility] office  Please call our office in 1 year to schedule your follow up appointment and echocardiogram  If you have any questions or concerns before your next appointment please send [place] a message through [place] or call our office at [phone].    TO LEAVE A MESSAGE FOR THE NURSE SELECT OPTION 2, PLEASE LEAVE A MESSAGE INCLUDING: . YOUR NAME . DATE OF BIRTH . CALL BACK NUMBER . REASON FOR CALL**this is important as we prioritize the call backs  YOU WILL RECEIVE A CALL BACK THE SAME DAY AS LONG AS YOU CALL BEFORE 4:00 PM  At the [facility], you and your health needs are our priority. As part of our continuing mission to provide you with exceptional heart care, we have created designated Provider Care Teams. These Care Teams include your primary Cardiologist (physician) and Advanced Practice Providers (APPs- Physician Assistants and Nurse Practitioners) who all work together to provide you with the care you need, when you need it.   You may see any of the following providers on your designated Care Team at your next follow up: Marland Kitchen Dr Arvilla Meres . Dr [name] . Tonye Becket, NP . Robbie Lis, PA . Karle Plumber, PharmD   Please be sure to bring in all your medications bottles to every appointment.

## 2020-05-20 ENCOUNTER — Other Ambulatory Visit (HOSPITAL_COMMUNITY): Payer: Self-pay | Admitting: Cardiology

## 2020-07-15 ENCOUNTER — Ambulatory Visit (INDEPENDENT_AMBULATORY_CARE_PROVIDER_SITE_OTHER): Payer: BC Managed Care – PPO

## 2020-07-15 DIAGNOSIS — I5022 Chronic systolic (congestive) heart failure: Secondary | ICD-10-CM

## 2020-07-15 LAB — CUP PACEART REMOTE DEVICE CHECK
Battery Remaining Longevity: 34 mo
Battery Remaining Percentage: 38 %
Battery Voltage: 2.9 V
Brady Statistic AP VP Percent: 5.3 %
Brady Statistic AP VS Percent: 1 %
Brady Statistic AS VP Percent: 94 %
Brady Statistic AS VS Percent: 1 %
Brady Statistic RA Percent Paced: 5.1 %
Date Time Interrogation Session: 20211214235211
HighPow Impedance: 100 Ohm
HighPow Impedance: 100 Ohm
Implantable Lead Implant Date: 20170512
Implantable Lead Implant Date: 20170512
Implantable Lead Implant Date: 20170512
Implantable Lead Location: 753858
Implantable Lead Location: 753859
Implantable Lead Location: 753860
Implantable Lead Model: 7122
Implantable Pulse Generator Implant Date: 20170512
Lead Channel Impedance Value: 1175 Ohm
Lead Channel Impedance Value: 490 Ohm
Lead Channel Impedance Value: 650 Ohm
Lead Channel Pacing Threshold Amplitude: 1 V
Lead Channel Pacing Threshold Amplitude: 1.25 V
Lead Channel Pacing Threshold Amplitude: 1.5 V
Lead Channel Pacing Threshold Pulse Width: 0.5 ms
Lead Channel Pacing Threshold Pulse Width: 0.5 ms
Lead Channel Pacing Threshold Pulse Width: 1 ms
Lead Channel Sensing Intrinsic Amplitude: 12 mV
Lead Channel Sensing Intrinsic Amplitude: 5 mV
Lead Channel Setting Pacing Amplitude: 2 V
Lead Channel Setting Pacing Amplitude: 2.25 V
Lead Channel Setting Pacing Amplitude: 2.5 V
Lead Channel Setting Pacing Pulse Width: 0.5 ms
Lead Channel Setting Pacing Pulse Width: 1 ms
Lead Channel Setting Sensing Sensitivity: 0.5 mV
Pulse Gen Serial Number: 7355027

## 2020-07-29 NOTE — Progress Notes (Signed)
Remote ICD transmission.   

## 2020-08-06 ENCOUNTER — Telehealth: Payer: Self-pay | Admitting: Internal Medicine

## 2020-08-06 ENCOUNTER — Other Ambulatory Visit (HOSPITAL_COMMUNITY): Payer: Self-pay | Admitting: Cardiology

## 2020-08-06 NOTE — Telephone Encounter (Signed)
Called pt and let him know there are no contraindications to wearing a fitbit, iWatch, etc while having a pacemaker.   He had no additional questions at this time.

## 2020-08-06 NOTE — Telephone Encounter (Signed)
Patient wanted to know if he would be allowed to wear a FitBit. The patient has a pacemaker and is not sure if the FitBit would interact with the pacemaker.   Please advise

## 2020-08-13 ENCOUNTER — Other Ambulatory Visit (HOSPITAL_COMMUNITY): Payer: Self-pay | Admitting: Cardiology

## 2020-08-19 ENCOUNTER — Other Ambulatory Visit (HOSPITAL_COMMUNITY): Payer: Self-pay | Admitting: Cardiology

## 2020-08-29 ENCOUNTER — Other Ambulatory Visit: Payer: Self-pay | Admitting: Internal Medicine

## 2020-10-05 ENCOUNTER — Telehealth (HOSPITAL_COMMUNITY): Payer: Self-pay | Admitting: *Deleted

## 2020-10-05 NOTE — Telephone Encounter (Signed)
Pt called stating his PCP prescribed him medication for ED but it isn't working as well as it is supposed to. Pt PCP advised pt to contact his cardiologist about options. Pts last office visit note from pcp faxed and given to Dr.McLean to follow up and advise.

## 2020-10-13 ENCOUNTER — Other Ambulatory Visit (HOSPITAL_COMMUNITY): Payer: Self-pay | Admitting: Cardiology

## 2020-10-14 ENCOUNTER — Other Ambulatory Visit (HOSPITAL_COMMUNITY): Payer: Self-pay | Admitting: Cardiology

## 2020-10-14 NOTE — Telephone Encounter (Signed)
What medication is he taking? Would be ok to take sildenafil 50 mg prn, and if this does not work, can increase to 100 mg prn.

## 2020-10-14 NOTE — Telephone Encounter (Signed)
Pt returned call to check on the status of medication request  Advised information was left for Dr Shirlee Latch to review however provider is out out town at this time. Turn around time could be delayed. Advised we would call as soon as possible   Pt appreciative of update and return call.

## 2020-10-15 MED ORDER — SILDENAFIL CITRATE 100 MG PO TABS: 100.0000 mg | ORAL_TABLET | Freq: Every day | ORAL | 3 refills | Status: AC | PRN

## 2020-10-15 NOTE — Telephone Encounter (Signed)
Spoke w/pt, PCP gave him sildenafil 20 mg he has taken as many as 4 at a time, which didn't really work.  Pt agreeable to try 100 mg, rx sent in

## 2020-10-15 NOTE — Addendum Note (Signed)
Addended by: Noralee Space on: 10/15/2020 10:05 AM   Modules accepted: Orders

## 2020-10-23 ENCOUNTER — Other Ambulatory Visit (HOSPITAL_COMMUNITY): Payer: Self-pay | Admitting: Cardiology

## 2020-11-17 ENCOUNTER — Other Ambulatory Visit (HOSPITAL_COMMUNITY): Payer: Self-pay | Admitting: Cardiology

## 2020-11-24 ENCOUNTER — Ambulatory Visit (INDEPENDENT_AMBULATORY_CARE_PROVIDER_SITE_OTHER): Payer: BC Managed Care – PPO

## 2020-11-24 DIAGNOSIS — I428 Other cardiomyopathies: Secondary | ICD-10-CM

## 2020-11-24 LAB — CUP PACEART REMOTE DEVICE CHECK
Battery Remaining Longevity: 29 mo
Battery Remaining Percentage: 34 %
Battery Voltage: 2.9 V
Brady Statistic AP VP Percent: 5 %
Brady Statistic AP VS Percent: 1 %
Brady Statistic AS VP Percent: 95 %
Brady Statistic AS VS Percent: 1 %
Brady Statistic RA Percent Paced: 4.8 %
Date Time Interrogation Session: 20220426015037
HighPow Impedance: 100 Ohm
HighPow Impedance: 100 Ohm
Implantable Lead Implant Date: 20170512
Implantable Lead Implant Date: 20170512
Implantable Lead Implant Date: 20170512
Implantable Lead Location: 753858
Implantable Lead Location: 753859
Implantable Lead Location: 753860
Implantable Lead Model: 7122
Implantable Pulse Generator Implant Date: 20170512
Lead Channel Impedance Value: 1175 Ohm
Lead Channel Impedance Value: 450 Ohm
Lead Channel Impedance Value: 640 Ohm
Lead Channel Pacing Threshold Amplitude: 1 V
Lead Channel Pacing Threshold Amplitude: 1.125 V
Lead Channel Pacing Threshold Amplitude: 1.5 V
Lead Channel Pacing Threshold Pulse Width: 0.5 ms
Lead Channel Pacing Threshold Pulse Width: 0.5 ms
Lead Channel Pacing Threshold Pulse Width: 1 ms
Lead Channel Sensing Intrinsic Amplitude: 12 mV
Lead Channel Sensing Intrinsic Amplitude: 5 mV
Lead Channel Setting Pacing Amplitude: 2 V
Lead Channel Setting Pacing Amplitude: 2.125
Lead Channel Setting Pacing Amplitude: 2.5 V
Lead Channel Setting Pacing Pulse Width: 0.5 ms
Lead Channel Setting Pacing Pulse Width: 1 ms
Lead Channel Setting Sensing Sensitivity: 0.5 mV
Pulse Gen Serial Number: 7355027

## 2020-12-16 NOTE — Progress Notes (Signed)
Remote ICD transmission.   

## 2021-01-13 ENCOUNTER — Telehealth: Payer: Self-pay | Admitting: Emergency Medicine

## 2021-01-13 NOTE — Telephone Encounter (Signed)
Called to assess patient for [facility] Alert received for CorVue below reference range trending down. Patient states not symptomatic and no obvious issues with swelling . Patient is complaint with all medications including lasix 20 mg tablets. Routing to Dr. Cindie Laroche.

## 2021-01-20 NOTE — Telephone Encounter (Signed)
J  lets check it again please now and see if improved  also can we refer him to [employer] and [facility] clinic

## 2021-02-06 ENCOUNTER — Other Ambulatory Visit (HOSPITAL_COMMUNITY): Payer: Self-pay | Admitting: Cardiology

## 2021-02-23 ENCOUNTER — Ambulatory Visit (INDEPENDENT_AMBULATORY_CARE_PROVIDER_SITE_OTHER): Payer: 59

## 2021-02-23 DIAGNOSIS — I428 Other cardiomyopathies: Secondary | ICD-10-CM

## 2021-02-23 LAB — CUP PACEART REMOTE DEVICE CHECK
Battery Remaining Longevity: 28 mo
Battery Remaining Percentage: 32 %
Battery Voltage: 2.89 V
Brady Statistic AP VP Percent: 5 %
Brady Statistic AP VS Percent: 1 %
Brady Statistic AS VP Percent: 95 %
Brady Statistic AS VS Percent: 1 %
Brady Statistic RA Percent Paced: 4.9 %
Date Time Interrogation Session: 20220726020021
HighPow Impedance: 100 Ohm
HighPow Impedance: 100 Ohm
Implantable Lead Implant Date: 20170512
Implantable Lead Implant Date: 20170512
Implantable Lead Implant Date: 20170512
Implantable Lead Location: 753858
Implantable Lead Location: 753859
Implantable Lead Location: 753860
Implantable Lead Model: 7122
Implantable Pulse Generator Implant Date: 20170512
Lead Channel Impedance Value: 1200 Ohm
Lead Channel Impedance Value: 450 Ohm
Lead Channel Impedance Value: 650 Ohm
Lead Channel Pacing Threshold Amplitude: 1 V
Lead Channel Pacing Threshold Amplitude: 1.25 V
Lead Channel Pacing Threshold Amplitude: 1.5 V
Lead Channel Pacing Threshold Pulse Width: 0.5 ms
Lead Channel Pacing Threshold Pulse Width: 0.5 ms
Lead Channel Pacing Threshold Pulse Width: 1 ms
Lead Channel Sensing Intrinsic Amplitude: 12 mV
Lead Channel Sensing Intrinsic Amplitude: 5 mV
Lead Channel Setting Pacing Amplitude: 2 V
Lead Channel Setting Pacing Amplitude: 2.25 V
Lead Channel Setting Pacing Amplitude: 2.5 V
Lead Channel Setting Pacing Pulse Width: 0.5 ms
Lead Channel Setting Pacing Pulse Width: 1 ms
Lead Channel Setting Sensing Sensitivity: 0.5 mV
Pulse Gen Serial Number: 7355027

## 2021-03-18 ENCOUNTER — Other Ambulatory Visit: Payer: Self-pay

## 2021-03-18 ENCOUNTER — Other Ambulatory Visit (HOSPITAL_COMMUNITY): Payer: Self-pay | Admitting: Cardiology

## 2021-03-18 ENCOUNTER — Ambulatory Visit: Payer: 59 | Admitting: Internal Medicine

## 2021-03-18 ENCOUNTER — Encounter: Payer: Self-pay | Admitting: Internal Medicine

## 2021-03-18 VITALS — BP 110/80 | HR 94 | Ht 79.0 in | Wt 363.4 lb

## 2021-03-18 DIAGNOSIS — F17200 Nicotine dependence, unspecified, uncomplicated: Secondary | ICD-10-CM

## 2021-03-18 DIAGNOSIS — G4733 Obstructive sleep apnea (adult) (pediatric): Secondary | ICD-10-CM

## 2021-03-18 DIAGNOSIS — Z9989 Dependence on other enabling machines and devices: Secondary | ICD-10-CM | POA: Diagnosis not present

## 2021-03-18 NOTE — Assessment & Plan Note (Addendum)
Emphasized need to stop May benefit from telephone program.

## 2021-03-18 NOTE — Assessment & Plan Note (Signed)
Benefits from CPAP but machine old, study unavailable. Needs to requalify so we can establish with new DME and replace machine. Plan- HST

## 2021-03-18 NOTE — Progress Notes (Signed)
03/18/21- 45 yoM Smoker for sleep evaluation self-referred with concern of OSA Medical problem list includes Non-ischemic CM, LBBB, CHF, Implanted ICD, Varicose Veins,  Epworth score-0 Body weight today-363 lbs Covid vax-none Sleep study in [place] > 12 years ago. Now using second CPAP machine> 45 years old. DME out of business- getting supplies on line. Sleeps much better with CPAP. Settings unknown. No ENT surgery or lung disease. Brother on CPAP. No complex parasomnia.  Prior to Admission medications   Medication Sig Start Date End Date Taking? Authorizing Provider  Aspirin-Acetaminophen 500-325 MG PACK Take 1 packet by mouth daily as needed (pain).    Yes [provider]  furosemide (LASIX) 20 MG tablet TAKE 1 TABLET (20 MG TOTAL) BY MOUTH DAILY AS NEEDED (FOR SEVERE EDEMA). 10/23/20  Yes Laurey Morale, MD  hydrochlorothiazide (HYDRODIURIL) 25 MG tablet TAKE 1 TABLET BY MOUTH EVERY DAY 08/19/20  Yes Laurey Morale, MD  multivitamin (ONE-A-DAY MEN'S) TABS tablet Take 1 tablet by mouth daily.   Yes [provider]  sildenafil (VIAGRA) 100 MG tablet Take 1 tablet (100 mg total) by mouth daily as needed for erectile dysfunction. 10/15/20  Yes Laurey Morale, MD  buPROPion (WELLBUTRIN SR) 150 MG 12 hr tablet TAKE 1 TABLET (150 MG TOTAL) BY MOUTH DAILY FOR 3 DAYS, THEN 1 TABLET (150 MG TOTAL) 2 (TWO) TIMES DAILY. Patient not taking: Reported on 03/18/2021 05/20/20 05/23/21  Laurey Morale, MD  carvedilol (COREG) 25 MG tablet Take 1 tablet (25 mg total) by mouth 2 (two) times daily. Patient not taking: Reported on 03/18/2021 04/27/20   Laurey Morale, MD  ENTRESTO 97-103 MG TAKE 1 TABLET BY MOUTH TWICE A DAY 03/18/21   Laurey Morale, MD  LORazepam (ATIVAN) 1 MG tablet Take 1 tablet 30 minutes prior to leaving house on day of procedure.  Bring second tablet in with you to office on day of procedure. Patient not taking: Reported on 03/18/2021 01/28/20   Chuck Hint, MD   spironolactone (ALDACTONE) 25 MG tablet TAKE 1 TABLET BY MOUTH EVERY DAY Patient not taking: Reported on 03/18/2021 02/08/21   Laurey Morale, MD   Past Medical History:  Diagnosis Date   Chronic systolic CHF (congestive heart failure) (HCC) 07/04/2015   LBBB (left bundle branch block) 12/11/2015   NICM (nonischemic cardiomyopathy) (HCC) 12/11/2015   Past Surgical History:  Procedure Laterality Date   CARDIAC CATHETERIZATION N/A 06/18/2015   Procedure: Right/Left Heart Cath and Coronary Angiography;  Surgeon: Laurey Morale, MD;  Location: [facility] INVASIVE CV LAB;  Service: Cardiovascular;  Laterality: N/A;   ENDOVENOUS ABLATION SAPHENOUS VEIN W/ LASER Right 01/30/2020   endovenous laser ablation right greater saphenous vein by Cari Caraway MD    EP IMPLANTABLE DEVICE N/A 12/11/2015   Procedure: BiV ICD Insertion CRT-D;  Surgeon: Duke Salvia, MD;  Location: [facility] INVASIVE CV LAB;  Service: Cardiovascular;  Laterality: N/A;   Family History  Problem Relation Age of Onset   Heart disease Mother    Ovarian cancer Mother    Diabetes Father    Lung cancer Father    Hyperlipidemia Father    Hypertension Father    Social History   Socioeconomic History   Marital status: Married    Spouse name: Not on file   Number of children: Not on file   Years of education: Not on file   Highest education level: Not on file  Occupational History   Not on file  Tobacco  Use   Smoking status: Some Days    Packs/day: 1.00    Types: Cigarettes   Smokeless tobacco: Never  Substance and Sexual Activity   Alcohol use: No    Alcohol/week: 0.0 standard drinks   Drug use: No   Sexual activity: Not on file  Other Topics Concern   Not on file  Social History Narrative   Not on file   Social Determinants of Health   Financial Resource Strain: Not on file  Food Insecurity: Not on file  Transportation Needs: Not on file  Physical Activity: Not on file  Stress: Not on file  Social Connections: Not on  file  Intimate Partner Violence: Not on file   ROS-see HPI   + = positive Constitutional:    weight loss, night sweats, fevers, chills, fatigue, lassitude. HEENT:    headaches, difficulty swallowing, tooth/dental problems, sore throat,       sneezing, itching, ear ache, nasal congestion, post nasal drip, snoring CV:    chest pain, orthopnea, PND, +swelling in lower extremities, anasarca,                     dizziness, palpitations Resp:   shortness of breath with exertion or at rest.                productive cough,   non-productive cough, coughing up of blood.              change in color of mucus.  wheezing.   Skin:    rash or lesions. GI:  No-   heartburn, indigestion, abdominal pain, nausea, vomiting, diarrhea,                 change in bowel habits, loss of appetite GU: dysuria, change in color of urine, no urgency or frequency.   flank pain. MS:   joint pain, stiffness, decreased range of motion, back pain. Neuro-     nothing unusual Psych:  change in mood or affect.  depression or anxiety.   memory loss.   OBJ- Physical Exam General- Alert, Oriented, Affect-appropriate, Distress- none acute, + obese Skin- rash-none, lesions- none, excoriation- none Lymphadenopathy- none Head- atraumatic            Eyes- Gross vision intact, PERRLA, conjunctivae and secretions clear            Ears- Hearing, canals-normal            Nose- Clear, no-Septal dev, mucus, polyps, erosion, perforation             Throat- Mallampati IV , mucosa clear , drainage- none, tonsils- atrophic,  +teeth repair Neck- flexible , trachea midline, no stridor , thyroid nl, carotid no bruit Chest - symmetrical excursion , unlabored           Heart/CV- RRR , no murmur , no gallop  , no rub, nl s1 s2                           - JVD- none , edema- none, stasis changes- none, varices- none           Lung- clear to P&A, wheeze- none, cough- none , dullness-none, rub- none           Chest wall-  Abd-  Br/ Gen/ Rectal-  Not done, not indicated Extrem- cyanosis- none, clubbing, none, atrophy- none, strength- nl Neuro- grossly intact to observation

## 2021-03-18 NOTE — Patient Instructions (Addendum)
Order- schedule home sleep test (Lives near [facility])  dx OSA  Please call us about 2 weeks after your sleep test for results and recommendations. We should be able to get you a new DME company and replace your old CPAP machine.

## 2021-03-19 NOTE — Progress Notes (Signed)
Remote ICD transmission.   

## 2021-05-18 ENCOUNTER — Other Ambulatory Visit (HOSPITAL_COMMUNITY): Payer: Self-pay | Admitting: Cardiology

## 2021-05-25 ENCOUNTER — Ambulatory Visit (INDEPENDENT_AMBULATORY_CARE_PROVIDER_SITE_OTHER): Payer: 59

## 2021-05-25 DIAGNOSIS — I428 Other cardiomyopathies: Secondary | ICD-10-CM | POA: Diagnosis not present

## 2021-05-25 LAB — CUP PACEART REMOTE DEVICE CHECK
Battery Remaining Longevity: 25 mo
Battery Remaining Percentage: 29 %
Battery Voltage: 2.89 V
Brady Statistic AP VP Percent: 5.2 %
Brady Statistic AP VS Percent: 1 %
Brady Statistic AS VP Percent: 94 %
Brady Statistic AS VS Percent: 1 %
Brady Statistic RA Percent Paced: 5 %
Date Time Interrogation Session: 20221025020016
HighPow Impedance: 100 Ohm
HighPow Impedance: 100 Ohm
Implantable Lead Implant Date: 20170512
Implantable Lead Implant Date: 20170512
Implantable Lead Implant Date: 20170512
Implantable Lead Location: 753858
Implantable Lead Location: 753859
Implantable Lead Location: 753860
Implantable Lead Model: 7122
Implantable Pulse Generator Implant Date: 20170512
Lead Channel Impedance Value: 1175 Ohm
Lead Channel Impedance Value: 440 Ohm
Lead Channel Impedance Value: 650 Ohm
Lead Channel Pacing Threshold Amplitude: 1 V
Lead Channel Pacing Threshold Amplitude: 1.5 V
Lead Channel Pacing Threshold Amplitude: 1.5 V
Lead Channel Pacing Threshold Pulse Width: 0.5 ms
Lead Channel Pacing Threshold Pulse Width: 0.5 ms
Lead Channel Pacing Threshold Pulse Width: 1 ms
Lead Channel Sensing Intrinsic Amplitude: 12 mV
Lead Channel Sensing Intrinsic Amplitude: 5 mV
Lead Channel Setting Pacing Amplitude: 2 V
Lead Channel Setting Pacing Amplitude: 2.5 V
Lead Channel Setting Pacing Amplitude: 2.5 V
Lead Channel Setting Pacing Pulse Width: 0.5 ms
Lead Channel Setting Pacing Pulse Width: 1 ms
Lead Channel Setting Sensing Sensitivity: 0.5 mV
Pulse Gen Serial Number: 7355027

## 2021-05-31 ENCOUNTER — Ambulatory Visit: Payer: 59

## 2021-05-31 ENCOUNTER — Ambulatory Visit: Payer: 59 | Admitting: Internal Medicine

## 2021-05-31 ENCOUNTER — Encounter: Payer: Self-pay | Admitting: Internal Medicine

## 2021-05-31 ENCOUNTER — Other Ambulatory Visit: Payer: Self-pay

## 2021-05-31 VITALS — BP 100/70 | HR 79 | Ht >= 80 in | Wt 365.0 lb

## 2021-05-31 DIAGNOSIS — Z95810 Presence of automatic (implantable) cardiac defibrillator: Secondary | ICD-10-CM

## 2021-05-31 DIAGNOSIS — I5022 Chronic systolic (congestive) heart failure: Secondary | ICD-10-CM

## 2021-05-31 DIAGNOSIS — Z9989 Dependence on other enabling machines and devices: Secondary | ICD-10-CM

## 2021-05-31 DIAGNOSIS — I428 Other cardiomyopathies: Secondary | ICD-10-CM

## 2021-05-31 DIAGNOSIS — Z79899 Other long term (current) drug therapy: Secondary | ICD-10-CM

## 2021-05-31 DIAGNOSIS — I447 Left bundle-branch block, unspecified: Secondary | ICD-10-CM | POA: Diagnosis not present

## 2021-05-31 DIAGNOSIS — G4733 Obstructive sleep apnea (adult) (pediatric): Secondary | ICD-10-CM

## 2021-05-31 LAB — CUP PACEART INCLINIC DEVICE CHECK
Battery Remaining Longevity: 22 mo
Brady Statistic RA Percent Paced: 4.9 %
Brady Statistic RV Percent Paced: 99.49 %
Date Time Interrogation Session: 20221031105335
HighPow Impedance: 96.75 Ohm
Implantable Lead Implant Date: 20170512
Implantable Lead Implant Date: 20170512
Implantable Lead Implant Date: 20170512
Implantable Lead Location: 753858
Implantable Lead Location: 753859
Implantable Lead Location: 753860
Implantable Lead Model: 7122
Implantable Pulse Generator Implant Date: 20170512
Lead Channel Impedance Value: 1262.5 Ohm
Lead Channel Impedance Value: 462.5 Ohm
Lead Channel Impedance Value: 662.5 Ohm
Lead Channel Pacing Threshold Amplitude: 0.75 V
Lead Channel Pacing Threshold Amplitude: 0.75 V
Lead Channel Pacing Threshold Amplitude: 1 V
Lead Channel Pacing Threshold Amplitude: 1 V
Lead Channel Pacing Threshold Amplitude: 1.5 V
Lead Channel Pacing Threshold Amplitude: 1.5 V
Lead Channel Pacing Threshold Pulse Width: 0.5 ms
Lead Channel Pacing Threshold Pulse Width: 0.5 ms
Lead Channel Pacing Threshold Pulse Width: 0.5 ms
Lead Channel Pacing Threshold Pulse Width: 0.5 ms
Lead Channel Pacing Threshold Pulse Width: 1 ms
Lead Channel Pacing Threshold Pulse Width: 1 ms
Lead Channel Sensing Intrinsic Amplitude: 12 mV
Lead Channel Sensing Intrinsic Amplitude: 5 mV
Lead Channel Setting Pacing Amplitude: 2 V
Lead Channel Setting Pacing Amplitude: 2.25 V
Lead Channel Setting Pacing Amplitude: 2.5 V
Lead Channel Setting Pacing Pulse Width: 0.5 ms
Lead Channel Setting Pacing Pulse Width: 1 ms
Lead Channel Setting Sensing Sensitivity: 0.5 mV
Pulse Gen Serial Number: 7355027

## 2021-05-31 LAB — COMPREHENSIVE METABOLIC PANEL
ALT: 29 IU/L (ref 0–44)
AST: 18 IU/L (ref 0–40)
Albumin/Globulin Ratio: 1.9 (ref 1.2–2.2)
Albumin: 4.6 g/dL (ref 4.0–5.0)
Alkaline Phosphatase: 77 IU/L (ref 44–121)
BUN/Creatinine Ratio: 9 (ref 9–20)
BUN: 9 mg/dL (ref 6–24)
Bilirubin Total: 0.4 mg/dL (ref 0.0–1.2)
CO2: 29 mmol/L (ref 20–29)
Calcium: 9.9 mg/dL (ref 8.7–10.2)
Chloride: 99 mmol/L (ref 96–106)
Creatinine, Ser: 0.99 mg/dL (ref 0.76–1.27)
Globulin, Total: 2.4 g/dL (ref 1.5–4.5)
Glucose: 103 mg/dL — ABNORMAL HIGH (ref 70–99)
Potassium: 3.9 mmol/L (ref 3.5–5.2)
Sodium: 139 mmol/L (ref 134–144)
Total Protein: 7 g/dL (ref 6.0–8.5)
eGFR: 96 mL/min/{1.73_m2} (ref >59)

## 2021-05-31 LAB — CBC
Hematocrit: 48.2 % (ref 37.5–51.0)
Hemoglobin: 16.6 g/dL (ref 13.0–17.7)
MCH: 31.4 pg (ref 26.6–33.0)
MCHC: 34.4 g/dL (ref 31.5–35.7)
MCV: 91 fL (ref 79–97)
Platelets: 296 10*3/uL (ref 150–450)
RBC: 5.29 x10E6/uL (ref 4.14–5.80)
RDW: 12.1 % (ref 11.6–15.4)
WBC: 10.9 10*3/uL — ABNORMAL HIGH (ref 3.4–10.8)

## 2021-05-31 NOTE — Patient Instructions (Signed)
Medication Instructions:  Your physician recommends that you continue on your current medications as directed. Please refer to the Current Medication list given to you today.  *If you need a refill on your cardiac medications before your next appointment, please call your pharmacy*   Lab Work: CBC and CMET today If you have labs (blood work) drawn today and your tests are completely normal, you will receive your results only by: MyChart Message (if you have MyChart) OR A paper copy in the mail If you have any lab test that is abnormal or we need to change your treatment, we will call you to review the results.   Testing/Procedures: None ordered.     Follow-Up: At [facility], you and your health needs are our priority.  As part of our continuing mission to provide you with exceptional heart care, we have created designated Provider Care Teams.  These Care Teams include your primary Cardiologist (physician) and Advanced Practice Providers (APPs -  Physician Assistants and Nurse Practitioners) who all work together to provide you with the care you need, when you need it.  We recommend signing up for the patient portal called "MyChart".  Sign up information is provided on this After Visit Summary.  MyChart is used to connect with patients for Virtual Visits (Telemedicine).  Patients are able to view lab/test results, encounter notes, upcoming appointments, etc.  Non-urgent messages can be sent to your provider as well.   To learn more about what you can do with MyChart, go to [email or web address].    Your next appointment:   12 month(s)  The format for your next appointment:   In Person  Provider:   Sherryl Manges, MD

## 2021-05-31 NOTE — Progress Notes (Deleted)
Patient Care Team: Lianne Moris, PA-C as PCP - General (Family Medicine)   HPI  Ethan York is a 45 y.o. male Seen in followup for Abbott CRT-D implanted 5/17 for NICM LBBB and CHF-  interval normalization of LV function    The patient denies chest pain***, shortness of breath***, nocturnal dyspnea***, orthopnea*** or peripheral edema***.  There have been no palpitations***, lightheadedness*** or syncope***.  Complains of ***.    Date Cr K Hgb  9/09   14.3  9/21 0.97 3.9       DATE TEST EF   11/16 Echo  20-25 %   3/17 Echo  25 %   11/20 Echo   55%     Records and Results Reviewed   Past Medical History:  Diagnosis Date   Chronic systolic CHF (congestive heart failure) (HCC) 07/04/2015   LBBB (left bundle branch block) 12/11/2015   NICM (nonischemic cardiomyopathy) (HCC) 12/11/2015    Past Surgical History:  Procedure Laterality Date   CARDIAC CATHETERIZATION N/A 06/18/2015   Procedure: Right/Left Heart Cath and Coronary Angiography;  Surgeon: Laurey Morale, MD;  Location: [facility] INVASIVE CV LAB;  Service: Cardiovascular;  Laterality: N/A;   ENDOVENOUS ABLATION SAPHENOUS VEIN W/ LASER Right 01/30/2020   endovenous laser ablation right greater saphenous vein by Cari Caraway MD    EP IMPLANTABLE DEVICE N/A 12/11/2015   Procedure: BiV ICD Insertion CRT-D;  Surgeon: Duke Salvia, MD;  Location: [facility] INVASIVE CV LAB;  Service: Cardiovascular;  Laterality: N/A;    Current Outpatient Medications  Medication Sig Dispense Refill   Aspirin-Acetaminophen 500-325 MG PACK Take 1 packet by mouth daily as needed (pain).      buPROPion (WELLBUTRIN SR) 150 MG 12 hr tablet TAKE 1 TABLET (150 MG TOTAL) BY MOUTH DAILY FOR 3 DAYS, THEN 1 TABLET (150 MG TOTAL) 2 (TWO) TIMES DAILY. (Patient not taking: Reported on 03/18/2021) 180 tablet 2   carvedilol (COREG) 25 MG tablet Take 1 tablet (25 mg total) by mouth 2 (two) times daily. (Patient not taking: Reported on 03/18/2021) 180 tablet 3    ENTRESTO 97-103 MG TAKE 1 TABLET BY MOUTH TWICE A DAY 180 tablet 1   furosemide (LASIX) 20 MG tablet Take 1 tablet (20 mg total) by mouth daily as needed (for severe edema). Please schedule an appointment 30 tablet 0   hydrochlorothiazide (HYDRODIURIL) 25 MG tablet TAKE 1 TABLET BY MOUTH EVERY DAY 90 tablet 3   LORazepam (ATIVAN) 1 MG tablet Take 1 tablet 30 minutes prior to leaving house on day of procedure.  Bring second tablet in with you to office on day of procedure. (Patient not taking: Reported on 03/18/2021) 2 tablet 0   multivitamin (ONE-A-DAY MEN'S) TABS tablet Take 1 tablet by mouth daily.     sildenafil (VIAGRA) 100 MG tablet Take 1 tablet (100 mg total) by mouth daily as needed for erectile dysfunction. 10 tablet 3   spironolactone (ALDACTONE) 25 MG tablet TAKE 1 TABLET BY MOUTH EVERY DAY (Patient not taking: Reported on 03/18/2021) 90 tablet 1   No current facility-administered medications for this visit.    No Known Allergies    Review of Systems negative except from HPI and PMH  Physical Exam There were no vitals taken for this visit. Well developed and nourished in no acute distress HENT normal Neck supple with JVP-flat Clear Regular rate and rhythm, no murmurs or gallops Abd-soft with active BS No Clubbing cyanosis  Tr edema Skin-warm and dry  A & Oriented  Grossly normal sensory and motor function   ECG sinus 84 Intervals 23/11/39  Assessment and Plan:  Nonischemic cardiomyopathy  Congestive heart failure-class II  Left bundle branch block  Morbidly obese  CRT-D  St Jude  Narrow QRS   DC heart failure next week or 2.  Will defer reassessment of LV function to them.  We will check surveillance laboratories on Aldactone and Entresto.  Euvolemic continue current meds      Current medicines are reviewed at length with the patient today .  The patient does not  have concerns regarding medicines.

## 2021-05-31 NOTE — Progress Notes (Signed)
Patient Care Team: Lianne Moris, PA-C as PCP - General (Family Medicine)   HPI  Ethan York is a 45 y.o. male Seen in followup for Abbott CRT-D implanted 5/17 for NICM LBBB and CHF-  interval normalization of LV function   Today, he is doing "decent". He has been experiencing back pain for several years. He tried to lose weight in the past but gain it after Covid. He reports occasional swelling in bilateral feet. He tolerates his medications. He avoids added salt in his diet.  The patient denies chest pain, shortness of breath, nocturnal dyspnea, orthopnea.  There have been no palpitations, lightheadedness or syncope.  Complains of back pain, LE edema (feet).    Date Cr K Hgb  9/09   14.3  9/21 0.97 3.9      DATE TEST EF   11/16 Echo  20-25 %   3/17 Echo  25 %   11/20 Echo   55%     Records and Results Reviewed   Past Medical History:  Diagnosis Date   Chronic systolic CHF (congestive heart failure) (HCC) 07/04/2015   LBBB (left bundle branch block) 12/11/2015   NICM (nonischemic cardiomyopathy) (HCC) 12/11/2015    Past Surgical History:  Procedure Laterality Date   CARDIAC CATHETERIZATION N/A 06/18/2015   Procedure: Right/Left Heart Cath and Coronary Angiography;  Surgeon: Laurey Morale, MD;  Location: [facility] INVASIVE CV LAB;  Service: Cardiovascular;  Laterality: N/A;   ENDOVENOUS ABLATION SAPHENOUS VEIN W/ LASER Right 01/30/2020   endovenous laser ablation right greater saphenous vein by Cari Caraway MD    EP IMPLANTABLE DEVICE N/A 12/11/2015   Procedure: BiV ICD Insertion CRT-D;  Surgeon: Duke Salvia, MD;  Location: [facility] INVASIVE CV LAB;  Service: Cardiovascular;  Laterality: N/A;    Current Outpatient Medications  Medication Sig Dispense Refill   Aspirin-Acetaminophen 500-325 MG PACK Take 1 packet by mouth daily as needed (pain).      carvedilol (COREG) 25 MG tablet Take 1 tablet (25 mg total) by mouth 2 (two) times daily. 180 tablet 3   ENTRESTO 97-103 MG  TAKE 1 TABLET BY MOUTH TWICE A DAY 180 tablet 1   furosemide (LASIX) 20 MG tablet Take 1 tablet (20 mg total) by mouth daily as needed (for severe edema). Please schedule an appointment 30 tablet 0   hydrochlorothiazide (HYDRODIURIL) 25 MG tablet TAKE 1 TABLET BY MOUTH EVERY DAY 90 tablet 3   multivitamin (ONE-A-DAY MEN'S) TABS tablet Take 1 tablet by mouth daily.     sildenafil (VIAGRA) 100 MG tablet Take 1 tablet (100 mg total) by mouth daily as needed for erectile dysfunction. 10 tablet 3   spironolactone (ALDACTONE) 25 MG tablet TAKE 1 TABLET BY MOUTH EVERY DAY 90 tablet 1   buPROPion (WELLBUTRIN SR) 150 MG 12 hr tablet TAKE 1 TABLET (150 MG TOTAL) BY MOUTH DAILY FOR 3 DAYS, THEN 1 TABLET (150 MG TOTAL) 2 (TWO) TIMES DAILY. (Patient not taking: Reported on 03/18/2021) 180 tablet 2   LORazepam (ATIVAN) 1 MG tablet Take 1 tablet 30 minutes prior to leaving house on day of procedure.  Bring second tablet in with you to office on day of procedure. (Patient not taking: Reported on 05/31/2021) 2 tablet 0   No current facility-administered medications for this visit.    No Known Allergies    Review of Systems negative except from HPI and PMH  Physical Exam BP 100/70   Pulse 79   Ht 7\' 1"  (  2.159 m)   Wt (!) 365 lb (165.6 kg)   SpO2 97%   BMI 35.52 kg/m  Well developed and Morbidly obese in no acute distress HENT normal Neck supple with JVP-flat Clear Device pocket well healed; without hematoma or erythema.  There is no tethering  Regular rate and rhythm, no murmur Abd-soft with active BS No Clubbing cyanosis   edema Skin-warm and dry A & Oriented  Grossly normal sensory and motor function  ECG sinus at 79 Interval 24/10/41 Possible prior IMI   Assessment and Plan:  Nonischemic cardiomyopathy  Congestive heart failure-class II  Left bundle branch block  Morbidly obese  CRT-D  St Jude  Device function is normal  Continue Aldactone 25, carvedilol 25 and Entresto 97/103.   We will check metabolic profile for surveillance on Aldactone  He is euvolemic.  Continue his hydrochlorothiazide 25.  He uses furosemide 20 as needed.  Check his hemoglobin as its been about 3 years.   I,Mykaella Javier,acting as a scribe for Sherryl Manges, MD.,have documented all relevant documentation on the behalf of Ethan Olmo, MD,as directed by  Sherryl Manges, MD while in the presence of Sherryl Manges, MD.   I, Sherryl Manges, MD, have reviewed all documentation for this visit. The documentation on 05/31/21 for the exam, diagnosis, procedures, and orders are all accurate and complete.

## 2021-06-01 NOTE — Progress Notes (Signed)
Remote ICD transmission.   

## 2021-06-09 DIAGNOSIS — G4733 Obstructive sleep apnea (adult) (pediatric): Secondary | ICD-10-CM | POA: Diagnosis not present

## 2021-06-19 ENCOUNTER — Other Ambulatory Visit (HOSPITAL_COMMUNITY): Payer: Self-pay | Admitting: Cardiology

## 2021-07-01 ENCOUNTER — Telehealth: Payer: Self-pay | Admitting: Internal Medicine

## 2021-07-01 DIAGNOSIS — G4733 Obstructive sleep apnea (adult) (pediatric): Secondary | ICD-10-CM

## 2021-07-01 NOTE — Telephone Encounter (Signed)
Dr. Maple Hudson, please advise if you have the results of pt's HST. Thanks!

## 2021-07-01 NOTE — Telephone Encounter (Signed)
Sleep study showed moderate sleep apnea, averaging 18 apneas/ hour with drops in blood oxygen level.  Please order new DME, new CPAP auto 5-20, mask of choice, humidifier, supplies, Airview/ card  He will need f/u appointment per insurancee regss in 31-90 days after he gets his machine

## 2021-07-02 NOTE — Telephone Encounter (Signed)
Spoke with the pt and notified of results of sleep study. Pt verbalized understanding and was agreeable to CPAP therapy. I have placed DME referral for this and pt aware to contact the office for 31-90 day f/u once they begin using machine per insurance requirement.   

## 2021-07-02 NOTE — Telephone Encounter (Signed)
LMTCB

## 2021-07-05 ENCOUNTER — Telehealth: Payer: Self-pay | Admitting: Internal Medicine

## 2021-07-05 NOTE — Telephone Encounter (Signed)
Will route to [facility];s to have the order sent to an in network provider

## 2021-07-05 NOTE — Telephone Encounter (Signed)
I have sent the order to [facility] 

## 2021-07-19 ENCOUNTER — Ambulatory Visit: Payer: 59 | Admitting: Internal Medicine

## 2021-08-22 ENCOUNTER — Other Ambulatory Visit (HOSPITAL_COMMUNITY): Payer: Self-pay | Admitting: Cardiology

## 2021-08-24 ENCOUNTER — Ambulatory Visit (INDEPENDENT_AMBULATORY_CARE_PROVIDER_SITE_OTHER): Payer: BC Managed Care – PPO

## 2021-08-24 DIAGNOSIS — I428 Other cardiomyopathies: Secondary | ICD-10-CM

## 2021-08-24 LAB — CUP PACEART REMOTE DEVICE CHECK
Battery Remaining Longevity: 23 mo
Battery Remaining Percentage: 27 %
Battery Voltage: 2.87 V
Brady Statistic AP VP Percent: 6.8 %
Brady Statistic AP VS Percent: 1 %
Brady Statistic AS VP Percent: 91 %
Brady Statistic AS VS Percent: 1.9 %
Brady Statistic RA Percent Paced: 6.5 %
Date Time Interrogation Session: 20230124020016
HighPow Impedance: 100 Ohm
HighPow Impedance: 100 Ohm
Implantable Lead Implant Date: 20170512
Implantable Lead Implant Date: 20170512
Implantable Lead Implant Date: 20170512
Implantable Lead Location: 753858
Implantable Lead Location: 753859
Implantable Lead Location: 753860
Implantable Lead Model: 7122
Implantable Pulse Generator Implant Date: 20170512
Lead Channel Impedance Value: 1175 Ohm
Lead Channel Impedance Value: 450 Ohm
Lead Channel Impedance Value: 660 Ohm
Lead Channel Pacing Threshold Amplitude: 0.75 V
Lead Channel Pacing Threshold Amplitude: 1 V
Lead Channel Pacing Threshold Amplitude: 1.375 V
Lead Channel Pacing Threshold Pulse Width: 0.5 ms
Lead Channel Pacing Threshold Pulse Width: 0.5 ms
Lead Channel Pacing Threshold Pulse Width: 1 ms
Lead Channel Sensing Intrinsic Amplitude: 12 mV
Lead Channel Sensing Intrinsic Amplitude: 5 mV
Lead Channel Setting Pacing Amplitude: 2 V
Lead Channel Setting Pacing Amplitude: 2.25 V
Lead Channel Setting Pacing Amplitude: 2.375
Lead Channel Setting Pacing Pulse Width: 0.5 ms
Lead Channel Setting Pacing Pulse Width: 1 ms
Lead Channel Setting Sensing Sensitivity: 0.5 mV
Pulse Gen Serial Number: 7355027

## 2021-09-03 NOTE — Progress Notes (Signed)
Remote ICD transmission.   

## 2021-09-26 ENCOUNTER — Other Ambulatory Visit (HOSPITAL_COMMUNITY): Payer: Self-pay | Admitting: Cardiology

## 2021-10-05 ENCOUNTER — Other Ambulatory Visit (HOSPITAL_COMMUNITY): Payer: Self-pay

## 2021-10-05 DIAGNOSIS — I5022 Chronic systolic (congestive) heart failure: Secondary | ICD-10-CM

## 2021-10-06 ENCOUNTER — Other Ambulatory Visit (HOSPITAL_COMMUNITY): Payer: Self-pay | Admitting: *Deleted

## 2021-10-06 MED ORDER — SPIRONOLACTONE 25 MG PO TABS: 25.0000 mg | ORAL_TABLET | Freq: Every day | ORAL | 11 refills | Status: DC

## 2021-10-07 ENCOUNTER — Ambulatory Visit (HOSPITAL_BASED_OUTPATIENT_CLINIC_OR_DEPARTMENT_OTHER)
Admission: RE | Admit: 2021-10-07 | Discharge: 2021-10-07 | Disposition: A | Discharge status: Home / Self Care | Payer: 59 | Source: Ambulatory Visit | Attending: Cardiology | Admitting: Cardiology

## 2021-10-07 ENCOUNTER — Other Ambulatory Visit (HOSPITAL_COMMUNITY): Payer: Self-pay

## 2021-10-07 ENCOUNTER — Encounter (HOSPITAL_COMMUNITY): Payer: Self-pay | Admitting: Cardiology

## 2021-10-07 ENCOUNTER — Ambulatory Visit (HOSPITAL_COMMUNITY)
Admission: RE | Admit: 2021-10-07 | Discharge: 2021-10-07 | Disposition: A | Discharge status: Home / Self Care | Payer: 59 | Source: Ambulatory Visit | Attending: Cardiology | Admitting: Cardiology

## 2021-10-07 ENCOUNTER — Other Ambulatory Visit: Payer: Self-pay

## 2021-10-07 VITALS — BP 148/98 | HR 66 | Wt 348.4 lb

## 2021-10-07 DIAGNOSIS — I110 Hypertensive heart disease with heart failure: Secondary | ICD-10-CM | POA: Insufficient documentation

## 2021-10-07 DIAGNOSIS — E669 Obesity, unspecified: Secondary | ICD-10-CM | POA: Diagnosis not present

## 2021-10-07 DIAGNOSIS — G4733 Obstructive sleep apnea (adult) (pediatric): Secondary | ICD-10-CM

## 2021-10-07 DIAGNOSIS — F17210 Nicotine dependence, cigarettes, uncomplicated: Secondary | ICD-10-CM | POA: Insufficient documentation

## 2021-10-07 DIAGNOSIS — I428 Other cardiomyopathies: Secondary | ICD-10-CM | POA: Diagnosis not present

## 2021-10-07 DIAGNOSIS — I10 Essential (primary) hypertension: Secondary | ICD-10-CM

## 2021-10-07 DIAGNOSIS — Z6833 Body mass index (BMI) 33.0-33.9, adult: Secondary | ICD-10-CM | POA: Diagnosis not present

## 2021-10-07 DIAGNOSIS — Z95810 Presence of automatic (implantable) cardiac defibrillator: Secondary | ICD-10-CM | POA: Insufficient documentation

## 2021-10-07 DIAGNOSIS — I5022 Chronic systolic (congestive) heart failure: Secondary | ICD-10-CM

## 2021-10-07 DIAGNOSIS — R69 Illness, unspecified: Secondary | ICD-10-CM | POA: Diagnosis not present

## 2021-10-07 LAB — BASIC METABOLIC PANEL
Anion gap: 8 (ref 5–15)
BUN: 12 mg/dL (ref 6–20)
CO2: 27 mmol/L (ref 22–32)
Calcium: 9.1 mg/dL (ref 8.9–10.3)
Chloride: 99 mmol/L (ref 98–111)
Creatinine, Ser: 0.9 mg/dL (ref 0.61–1.24)
GFR, Estimated: >60 mL/min (ref >60)
Glucose, Bld: 100 mg/dL — ABNORMAL HIGH (ref 70–99)
Potassium: 3.8 mmol/L (ref 3.5–5.1)
Sodium: 134 mmol/L — ABNORMAL LOW (ref 135–145)

## 2021-10-07 LAB — ECHOCARDIOGRAM COMPLETE
AR max vel: 3.61 cm2
AV Peak grad: 5.5 mmHg
Ao pk vel: 1.18 m/s
Area-P 1/2: 3.37 cm2
S' Lateral: 3.3 cm

## 2021-10-07 LAB — BRAIN NATRIURETIC PEPTIDE: B Natriuretic Peptide: 51 pg/mL (ref 0.0–100.0)

## 2021-10-07 MED ORDER — PERFLUTREN LIPID MICROSPHERE
1.0000 mL | INTRAVENOUS | Status: DC | PRN
  Administered 2021-10-07: 11:00:00 4 mL via INTRAVENOUS
  Filled 2021-10-07: qty 10

## 2021-10-07 MED ORDER — EMPAGLIFLOZIN 10 MG PO TABS: 10.0000 mg | ORAL_TABLET | Freq: Every day | ORAL | 11 refills | Status: DC

## 2021-10-07 MED ORDER — CARVEDILOL 12.5 MG PO TABS: 12.5000 mg | ORAL_TABLET | Freq: Two times a day (BID) | ORAL | 1 refills | Status: DC

## 2021-10-07 MED ORDER — EMPAGLIFLOZIN 10 MG PO TABS: 10.0000 mg | ORAL_TABLET | Freq: Every day | ORAL | 0 refills | Status: DC

## 2021-10-07 NOTE — Progress Notes (Signed)
Echocardiogram ?2D Echocardiogram has been performed. ? ?Ethan York ?10/07/2021, 10:53 AM ?

## 2021-10-07 NOTE — Progress Notes (Signed)
Patient ID: Ethan York, male   DOB: [birth year], 46 y.o.   MRN: [id number] ? ?PCP: Dr. Tressie Ellis [facility] ?Cardiology: Dr. Aundra Dubin ? ?46 y.o. with history of HTN and OSA was admitted to [facility] in 11/16 with several weeks of progressive dyspnea and was found to have acute systolic CHF with EF 0000000 by echo.  No chest pain.  He was diuresed and had left and right heart cath, showing preserved cardiac output with elevated filling pressures and no significant CAD.  Echo 3/17 with persistently decreased EF, 25%.  CPX 4/17 with moderate functional limitation due to heart failure.  He had St Jude CRT-D placed in 5/17.  Echo 10/18 showed EF 30-35% with diffuse hypokinesis.  Echo in 10/19 showed EF 50-55%, aortic root 4.4 cm. CTA chest in 1/20 showed no ascending aorta or root aneurysm. Echo in 11/20 with EF up to 55-60%.  ? ?Echo was done today and reviewed, EF 45% with mild LVH and normal RV.  ? ?He returns for followup of CHF.  Weight is down about 30 lbs.  He has been watching his diet more closely. BP is elevated today.  He has not been taking Coreg for about a year now.  No dyspnea with normal activities, he does get short of breath with heavy lifting.  No orthopnea/PND.  No chest pain.  No lightheadedness.  ? ?St Jude device interrogation: >99% BiV pacing, stable thoracic impedance.   ? ?Labs (11/16): K 3.3, creatinine 1.19, LDL 84, BNP 884, TSH normal ?Labs (12/16): K 4.3, creatinine 1.06, BNP 123 ?Labs (07/2015): K 4.8 Creatinine 1.04  ?Labs (09/2015): Creatinine 1.26  ?Labs (5/17): K 4.1, creatinine 0.96 ?Labs (9/18): TSH normal, K 3.8, creatihine 1.03, hgb 15.4 ?Labs (9/19): K 4.4, creatinine 1.05 => 1.02 => 0.9, hgb 14.3, LDL 95 ?Labs (1/20): LDL 95 ?Labs (10/20): LDL 94, TGs 172, K 3.6, creatinine 1.08 ?Labs (10/22): K 3.9, creatinine 0.99, hgb 16.6 ? ?ECG (personally reviewed): NSR, BiV pacing ? ?PMH: ?1. HTN ?2. OSA: Using CPAP   ?3. GERD ?4. Chronic systolic CHF: Nonischemic cardiomyopathy.   ?- Echo  (11/16) with EF 20-25%, diffuse hypokinesis, mild-moderate MR, PASP 35 mmHg, severe LAE.  - LHC/RHC (11/16) with no significant CAD; mean RA 9, PA 50/29 mean 37, mean PCWP 22, CI 2.47, PVR 2.13, LVEDP 30.  ?- Unable to fit for cardiac MRI. ?- Echo (3/17) with EF 25%, septal-lateral dyssynchrony, mild to moderately dilated LV, normal RV size and systolic function.  ?- CPX (4/17): peak VO2 16.6 (54% predicted), VE/VCO2 slope 25, RER 1.13 => moderate HF limitation.  ?- 5/17 St Jude CRT-D placed.  ?- Echo (10/18): EF 30-35%, diffuse hypokinesis, normal RV size and systolic function.  ?- Echo (10/19): EF 50-55%, aortic root 4.4 cm.  ?- CPX (10/19): Peak VO2 16.5, VE/CO2 26, RER 1.15 => moderate functional impairment, likely due to weight/body habitus.   ?- Echo (11/20): EF 55-60%, normal RV.  ?- Echo (3/23): EF 45%, mild LVH, normal RV.  ?5. LBBB ?6. Active smoker ?7. Dilated aortic root: 4.4 cm on 10/19 echo.  ?- CTA chest (1/20) did not show dilated root or ascending aorta.  ?8. Venous insufficiency.  ? ?FH: ?- Brother with MI at 82 ?- Mother with MI in her 30s ?- No FH of cardiomyopathy.  ? ?SH: Married, lives in 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  He smoked 2.5 ppd, now down to 4 cigs/day.  Occasional  ETOH.  ? ?ROS: All systems reviewed and negative except as per HPI.  ? ?Current Outpatient Medications  ?Medication Sig Dispense Refill  ? Aspirin-Acetaminophen 500-325 MG PACK Take 1 packet by mouth daily as needed (pain).     ? ENTRESTO 97-103 MG TAKE 1 TABLET BY MOUTH TWICE A DAY 180 tablet 1  ? furosemide (LASIX) 20 MG tablet TAKE 1 TABLET BY MOUTH DAILY AS NEEDED (FOR SEVERE EDEMA). PLEASE SCHEDULE AN APPOINTMENT 30 tablet 0  ? multivitamin (ONE-A-DAY MEN'S) TABS tablet Take 1 tablet by mouth daily.    ? sildenafil (VIAGRA) 100 MG tablet Take 1 tablet (100 mg total) by mouth daily as needed for erectile dysfunction. 10 tablet 3  ? spironolactone (ALDACTONE) 25 MG tablet Take  1 tablet (25 mg total) by mouth daily. 30 tablet 11  ? carvedilol (COREG) 12.5 MG tablet Take 1 tablet (12.5 mg total) by mouth 2 (two) times daily. 60 tablet 1  ? empagliflozin (JARDIANCE) 10 MG TABS tablet Take 1 tablet (10 mg total) by mouth daily before breakfast. 30 tablet 11  ? ?No current facility-administered medications for this encounter.  ? ?BP (!) 148/98   Pulse 66   Wt (!) 158 kg (348 lb 6.4 oz)   SpO2 97%   BMI 33.90 kg/m?  ?General: NAD ?Neck: No JVD, no thyromegaly or thyroid nodule.  ?Lungs: Clear to auscultation bilaterally with normal respiratory effort. ?CV: Nondisplaced PMI.  Heart regular S1/S2, no S3/S4, no murmur.  Trace ankle edema.  No carotid bruit.  Normal pedal pulses.  ?Abdomen: Soft, nontender, no hepatosplenomegaly, no distention.  ?Skin: Intact without lesions or rashes.  ?Neurologic: Alert and oriented x 3.  ?Psych: Normal affect. ?Extremities: No clubbing or cyanosis. Venous varicosities.  ?HEENT: Normal.  ? ?Assessment/Plan: ?1. Chronic systolic CHF: Nonischemic cardiomyopathy.  EF 20-25% on 11/16 echo, repeat echo in 3/17 with EF 25%, septal-lateral dyssynchrony.  Unable to fit in MRI.  Cause of cardiomyopathy still uncertain.  No significant CAD on coronary angiography.  Has family history of CAD but not cardiomyopathy.  Possible viral myocarditis, also consider LBBB cardiomyopathy. He now has [employer] officer[employer] CRT-D device.  Echo 10/18 with EF 30-35%. Most recent echo in 11/20 showed EF up to 55-60%.  Echo today showed EF down to 45%.  He has not been taking Coreg for about a year and BP is high.  NYHA class I-II symptoms.  He is not volume overloaded on exam or by Corvue.  ?- Continue Entresto 97/103 bid.  ?- Restart Coreg at 12.5 mg bid.  ?- Continue spironolactone 25 mg daily.  ?- Stop HCTZ, start Farxiga vs Jardiance 10 mg daily.  BMET/BNP today, BMET in a few weeks.    ?- Will repeat echo in 6 months or so after titrating back up Coreg.  ?- He has Lasix 20 mg to use prn.  ?2.  Smoking: Working on quitting. ?3. OSA: Continue nightly CPAP.  ?4. Obesity: Weight trending down.  ?5.  HTN: Adding back Coreg as above.  ? ?Followup with HF pharmacist in 1 month to continue to titrate up Coreg.  See me in 4 months.  ? ?Loralie Champagne ?10/07/2021 ? ?

## 2021-10-07 NOTE — Patient Instructions (Addendum)
Medication Changes: ? ?Stop Hydrochlorothiazide ? ?Restart Carvedilol 12.5 mg Twice daily ? ?Start Jardiance 10 mg Daily  ? ?Lab Work: ? ?Labs done today, your results will be available in MyChart, we will contact you for abnormal readings. ? ? ?Testing/Procedures: ? ?none ? ?Referrals: ? ?Please follow up with our heart failure pharmacist in 1 month ? ? ?Special Instructions // Education: ? ?PLEASE TAKE MEDICATIONS AS PRESCRIBED BY DOCTOR ? ?Follow-Up in: 4 months (July 2023)  **you are advised to call the office in mid May to arrange your follow up appointment** ? ?At the [facility], you and your health needs are our priority. We have a designated team specialized in the treatment of Heart Failure. This Care Team includes your primary Heart Failure Specialized Cardiologist (physician), Advanced Practice Providers (APPs- Physician Assistants and Nurse Practitioners), and Pharmacist who all work together to provide you with the care you need, when you need it.  ? ?You may see any of the following providers on your designated Care Team at your next follow up: ? ?Dr Arvilla Meres ?Dr [name] ?Tonye Becket, NP ?Robbie Lis, PA ?Jessica Milford,NP ?Anna Genre, PA ?Karle Plumber, PharmD ? ? ?Please be sure to bring in all your medications bottles to every appointment.  ? ?Need to Contact us: ? ?If you have any questions or concerns before your next appointment please send [place] a message through [place] or call our office at [phone].   ? ?TO LEAVE A MESSAGE FOR THE NURSE SELECT OPTION 2, PLEASE LEAVE A MESSAGE INCLUDING: ?YOUR NAME ?DATE OF BIRTH ?CALL BACK NUMBER ?REASON FOR CALL**this is important as we prioritize the call backs ? ?YOU WILL RECEIVE A CALL BACK THE SAME DAY AS LONG AS YOU CALL BEFORE 4:00 PM ? ? ?

## 2021-10-08 ENCOUNTER — Other Ambulatory Visit (HOSPITAL_COMMUNITY): Payer: Self-pay

## 2021-10-11 ENCOUNTER — Telehealth (HOSPITAL_COMMUNITY): Payer: Self-pay | Admitting: Pharmacy Technician

## 2021-10-11 ENCOUNTER — Other Ambulatory Visit (HOSPITAL_COMMUNITY): Payer: Self-pay

## 2021-10-11 NOTE — Telephone Encounter (Signed)
Patient Advocate Encounter ?  ?Received notification from [employer] that prior authorization for London Pepper is required. ?  ?PA submitted on CoverMyMeds ?Key [id number] ?Status is pending ?  ?Will continue to follow. ? ?

## 2021-10-13 ENCOUNTER — Other Ambulatory Visit (HOSPITAL_COMMUNITY): Payer: Self-pay | Admitting: Cardiology

## 2021-10-14 ENCOUNTER — Other Ambulatory Visit (HOSPITAL_COMMUNITY): Payer: Self-pay

## 2021-10-14 NOTE — Telephone Encounter (Signed)
Advanced Heart Failure Patient Advocate Encounter ? ?Prior Authorization for London Pepper has been approved.   ? ?PA# [id number] ?Effective dates: 10/11/21 through 10/12/22 ? ?Patients co-pay is $50 (insurance allows 30 day billing). Co-pay would be between $0-$15 with co-pay card. ? ?Archer Asa, CPhT ? ?

## 2021-10-15 ENCOUNTER — Telehealth (HOSPITAL_COMMUNITY): Payer: Self-pay | Admitting: *Deleted

## 2021-10-15 NOTE — Telephone Encounter (Signed)
He's been on Coreg before in the past without symptoms.  Do not think that is likely to causing his headache.  If BP is still running high, that could cause headache.  Check daily and give [place] readings next week.  ?

## 2021-10-15 NOTE — Telephone Encounter (Signed)
Pt called c/o headache with new bp med. Pt said his bp is 147/93.  Pt asked if he needed to make any changes  ? ?Routed to Dr.McLean  ?

## 2021-10-18 NOTE — Telephone Encounter (Signed)
Pt aware and agreeable with plan.  

## 2021-10-26 ENCOUNTER — Other Ambulatory Visit (HOSPITAL_COMMUNITY): Payer: Self-pay

## 2021-11-09 NOTE — Progress Notes (Incomplete)
***In Progress*** ? ?  ?Advanced Heart Failure Clinic Note  ?PCP: Dr. Tressie Ellis [facility]) ?Cardiology: Dr. Aundra Dubin ? ?HPI:  ?46 y.o. with history of HTN and OSA was admitted to [facility] in 06/2015 with several weeks of progressive dyspnea and was found to have acute systolic CHF with EF 0000000 by echo.  No chest pain.  He was diuresed and had left and right heart cath, showing preserved cardiac output with elevated filling pressures and no significant CAD.  Echo 09/2015 with persistently decreased EF, 25%.  CPX 10/2015 with moderate functional limitation due to heart failure.  He had St Jude CRT-D placed in 11/2015.  Echo 05/2017 showed EF 30-35% with diffuse hypokinesis.  Echo in 05/2018 showed EF 50-55%, aortic root 4.4 cm. CTA chest in 08/2018 showed no ascending aorta or root aneurysm. Echo in 06/2019 with EF up to 55-60%.  ?  ?Echo 10/07/21: EF 45% with mild LVH and normal RV.  ?  ?Returned for followup of CHF 10/07/21.  Weight was down about 30 lbs.  He had been watching his diet more closely. BP was elevated,  he had not been taking carvedilol for about a year.  No dyspnea with normal activities, he endorsed getting short of breath with heavy lifting.  No orthopnea/PND.  No chest pain.  No lightheadedness.  ? ?Today he returns to HF clinic for pharmacist medication titration. At last visit with MD carvedilol 12.5 mg BID was restarted, hydrochlorothiazide was stopped, and Jardiance 10 mg was started.  ? ?Shortness of breath/dyspnea on exertion? {YES NO:22349}  ?Orthopnea/PND? {YES NO:22349} ?Edema? {YES NO:22349} ?Lightheadedness/dizziness? {YES NO:22349} ?Daily weights at home? {YES NO:22349} ?Blood pressure/heart rate monitoring at home? {YES NO:22349} ?Following low-sodium/fluid-restricted diet? {YES NO:22349} ? ?HF Medications: ?Carvedilol 12.5 mg BID ?Entresto 97/103 mg BID ?Spironolactone 25 mg daily  ?Jardiance 10 mg daily ?Furosemide 20 mg daily prn ? ?Has the patient been experiencing any side effects to the  medications prescribed?  {YES NO:22349} ? ?Does the patient have any problems obtaining medications due to transportation or finances?   {YES NO:22349} ? ?Understanding of regimen: {excellent/good/fair/poor:19665} ?Understanding of indications: {excellent/good/fair/poor:19665} ?Potential of compliance: {excellent/good/fair/poor:19665} ?Patient understands to avoid NSAIDs. ?Patient understands to avoid decongestants. ?  ? ?Pertinent Lab Values: ?10/07/21: Serum creatinine 0.90, BUN 12, Potassium 3.8, Sodium 134, BNP 51 ? ?Vital Signs: ?Weight: *** (last clinic weight: 348 lbs) ?Blood pressure: ***  ?Heart rate: ***  ? ?Assessment/Plan: ?1. Chronic systolic CHF: Nonischemic cardiomyopathy.  EF 20-25% on 06/2015 echo, repeat echo in 09/2015 with EF 25%, septal-lateral dyssynchrony.  Unable to fit in MRI.  Cause of cardiomyopathy still uncertain.  No significant CAD on coronary angiography.  Has family history of CAD but not cardiomyopathy.  Possible viral myocarditis, also consider LBBB cardiomyopathy. He now has Research officer[employer] CRT-D device.  Echo 05/2017 with EF 30-35%. Echo in 06/2019 showed EF up to 55-60%.  Echo 09/2021 showed EF down to 45%. ?- NYHA class I-II symptoms.  He is not volume overloaded on exam ?- Continue Furosemide 20 mg daily prn  ?- Carvedilol  ?- Continue Entresto 97/103 bid.  ?- Continue spironolactone 25 mg daily.  ?- Continue Jardiance 10 mg daily  ?- Will repeat echo in 6 months or so after titrating back up Coreg.  ? ?2. Smoking: Working on quitting. ? ?3. OSA: Continue nightly CPAP.  ? ?4. Obesity: Weight trending down.  ? ?5.  HTN: Carvedilol D0SE ? ?Follow up NOT SCHEDULED. ? ? ?Audry Riles, PharmD, BCPS,  BCCP, CPP ?Heart Failure Clinic Pharmacist ?[phone] ? ? ?

## 2021-11-10 ENCOUNTER — Other Ambulatory Visit (HOSPITAL_COMMUNITY): Payer: Self-pay

## 2021-11-10 ENCOUNTER — Ambulatory Visit (HOSPITAL_COMMUNITY)
Admission: RE | Admit: 2021-11-10 | Discharge: 2021-11-10 | Disposition: A | Discharge status: Home / Self Care | Payer: 59 | Source: Ambulatory Visit | Attending: Internal Medicine | Admitting: Internal Medicine

## 2021-11-10 VITALS — BP 120/78 | HR 75 | Wt 342.2 lb

## 2021-11-10 DIAGNOSIS — Z79899 Other long term (current) drug therapy: Secondary | ICD-10-CM | POA: Diagnosis not present

## 2021-11-10 DIAGNOSIS — I5022 Chronic systolic (congestive) heart failure: Secondary | ICD-10-CM | POA: Insufficient documentation

## 2021-11-10 DIAGNOSIS — I428 Other cardiomyopathies: Secondary | ICD-10-CM | POA: Diagnosis not present

## 2021-11-10 DIAGNOSIS — I110 Hypertensive heart disease with heart failure: Secondary | ICD-10-CM | POA: Insufficient documentation

## 2021-11-10 DIAGNOSIS — Z8249 Family history of ischemic heart disease and other diseases of the circulatory system: Secondary | ICD-10-CM | POA: Insufficient documentation

## 2021-11-10 DIAGNOSIS — E669 Obesity, unspecified: Secondary | ICD-10-CM | POA: Insufficient documentation

## 2021-11-10 DIAGNOSIS — G4733 Obstructive sleep apnea (adult) (pediatric): Secondary | ICD-10-CM | POA: Diagnosis not present

## 2021-11-10 LAB — BASIC METABOLIC PANEL
Anion gap: 8 (ref 5–15)
BUN: 9 mg/dL (ref 6–20)
CO2: 24 mmol/L (ref 22–32)
Calcium: 9.2 mg/dL (ref 8.9–10.3)
Chloride: 106 mmol/L (ref 98–111)
Creatinine, Ser: 0.95 mg/dL (ref 0.61–1.24)
GFR, Estimated: >60 mL/min (ref >60)
Glucose, Bld: 111 mg/dL — ABNORMAL HIGH (ref 70–99)
Potassium: 3.5 mmol/L (ref 3.5–5.1)
Sodium: 138 mmol/L (ref 135–145)

## 2021-11-10 MED ORDER — CARVEDILOL 25 MG PO TABS: 25.0000 mg | ORAL_TABLET | Freq: Two times a day (BID) | ORAL | 3 refills | Status: DC

## 2021-11-10 MED ORDER — EMPAGLIFLOZIN 10 MG PO TABS
10.0000 mg | ORAL_TABLET | Freq: Every day | ORAL | 3 refills | Status: DC
Start: 2021-11-10 — End: 2021-11-30
  Filled 2021-11-10: qty 30, 30d supply, fill #0
  Filled 2021-11-30: qty 90, 90d supply, fill #0

## 2021-11-10 MED ORDER — ENTRESTO 97-103 MG PO TABS: 1.0000 | ORAL_TABLET | Freq: Two times a day (BID) | ORAL | 3 refills | Status: DC

## 2021-11-10 MED ORDER — SPIRONOLACTONE 25 MG PO TABS: 25.0000 mg | ORAL_TABLET | Freq: Every day | ORAL | 3 refills | Status: DC

## 2021-11-10 NOTE — Progress Notes (Signed)
?  ?Advanced Heart Failure Clinic Note  ? ?PCP: Dr. Tommi Rumps [facility]) ?Cardiology: Dr. Shirlee Latch ? ?HPI:  ?46 y.o. with history of HTN and OSA was admitted to [facility] in 06/2015 with several weeks of progressive dyspnea and was found to have acute systolic CHF with EF 20-25% by echo.  No chest pain.  He was diuresed and had left and right heart cath, showing preserved cardiac output with elevated filling pressures and no significant CAD.  Echo 09/2015 with persistently decreased EF, 25%.  CPX 10/2015 with moderate functional limitation due to heart failure.  He had St Jude CRT-D placed in 11/2015.  Echo 05/2017 showed EF 30-35% with diffuse hypokinesis.  Echo in 05/2018 showed EF 50-55%, aortic root 4.4 cm. CTA chest in 08/2018 showed no ascending aorta or root aneurysm. Echo in 06/2019 with EF up to 55-60%.  ?  ?Echo 10/07/21: EF 45% with mild LVH and normal RV.  ?  ?Returned to [facility] for followup of CHF 10/07/21.  Weight was down about 30 lbs.  He had been watching his diet more closely. BP was elevated,  he had not been taking carvedilol for about a year.  No dyspnea with normal activities, he endorsed getting short of breath with heavy lifting.  No orthopnea/PND.  No chest pain.  No lightheadedness.  ? ?Today he returns to HF clinic for pharmacist medication titration. At last visit with MD carvedilol 12.5 mg BID was restarted, hydrochlorothiazide was stopped, and Jardiance 10 mg was started. Overall feeling alright today. Complains of headache 2-3x per day since last visit. Pain relieved with Goody's powder, we discussed using Tylenol instead. Home BP consistently around 150/65. BP in clinic today 120/78. Denies dizziness/lightheadedness. Endorses some fatigue after recently taking care of grandchildren for 1 week. Denies CP. Says sometimes it feels like his heart "jumps." Reports breathing is good, denies SOB. Weight at home stable between 342-344 lbs. Takes furosemide 20 mg PRN rarely, once every 2 months.  Denies LEE/PND/Orthopnea. Says his appetite increased after starting Jardiance, but is back to normal within the last week. Endorses following a low salt diet.  ? ?HF Medications: ?Carvedilol 12.5 mg BID ?Entresto 97/103 mg BID ?Spironolactone 25 mg daily  ?Jardiance 10 mg daily ?Furosemide 20 mg daily PRN ? ?Has the patient been experiencing any side effects to the medications prescribed? No ? ?Does the patient have any problems obtaining medications due to transportation or finances?   Hospital doctor. Complained of $50 co-pay with [place] Pepper, has tried to use a co-pay card but was told it was out of date. Co-pay card should still work, instructed to call on 11/30/21 at next fill to ensure co-pay card is working. Jardiance copay card billing informationJohnney Killian: [id number], PCN: [id number], Grp: [id number], ID: [id number] ? ?Understanding of regimen: good ?Understanding of indications: good ?Potential of compliance: good ?Patient understands to avoid NSAIDs. ?Patient understands to avoid decongestants. ? ?Pertinent Lab Values: ?10/07/21: Serum creatinine 0.90, BUN 12, Potassium 3.8, Sodium 134, BNP 51 ?BMET today pending ? ?Vital Signs: ?Weight: 342.2 lbs (last clinic weight: 348 lbs) ?Blood pressure: 120/78  ?Heart rate: 75  ? ?Assessment/Plan: ?1. Chronic systolic CHF: Nonischemic cardiomyopathy.  EF 20-25% on 06/2015 echo, repeat echo in 09/2015 with EF 25%, septal-lateral dyssynchrony.  Unable to fit in MRI.  Cause of cardiomyopathy still uncertain.  No significant CAD on coronary angiography.  Has family history of CAD but not cardiomyopathy.  Possible viral myocarditis, also consider LBBB cardiomyopathy. He now has  St Jude CRT-D device.  Echo 05/2017 with EF 30-35%. Echo in 06/2019 showed EF up to 55-60%.  Echo 09/2021 showed EF down to 45%. ?- NYHA class I-II symptoms.  He is not volume overloaded on exam ?- BMET today pending ?- Continue Furosemide 20 mg daily PRN  ?- Increase Carvedilol to 25 mg BID ?- Continue  Entresto 97/103 mg BID.  ?- Continue spironolactone 25 mg daily.  ?- Continue Jardiance 10 mg daily  ?- Repeat ECHO in 5 months per Dr. Shirlee Latch ? ?2. Smoking: Working on quitting. ? ?3. OSA: Continue nightly CPAP.  ? ?4. Obesity: Weight trending down.  ? ?5.  HTN: BP controlled at 120/78 in clinic today. ?- Increase carvedilol as above.  ?- Instructed patient to get Large BP cuff, home BP cuff may be too small and reading higher.  ? ?Follow up with Dr. Shirlee Latch in 3 months.  ? ? ?Karle Plumber, PharmD, BCPS, BCCP, CPP ?Heart Failure Clinic Pharmacist ?[phone] ? ? ? ?

## 2021-11-10 NOTE — Patient Instructions (Signed)
It was a pleasure seeing you today! ? ?MEDICATIONS: ?-We are changing your medications today ?-Increase carvedilol to 25 mg (1 tablet) twice daily.  ?-Call if you have questions about your medications. ? ?LABS: ?-We will call you if your labs need attention. ? ?NEXT APPOINTMENT: ?Return to clinic in 3 months with Dr. Shirlee Latch. Please call the clinic at [phone] in May to schedule.  ? ?In general, to take care of your heart failure: ?-Limit your fluid intake to 2 Liters (half-gallon) per day.   ?-Limit your salt intake to ideally 2-3 grams (2000-3000 mg) per day. ?-Weigh yourself daily and record, and bring that "weight diary" to your next appointment.  (Weight gain of 2-3 pounds in 1 day typically means fluid weight.) ?-The medications for your heart are to help your heart and help you live longer.   ?-Please contact us before stopping any of your heart medications. ? ?Call the clinic at [phone] with questions or to reschedule future appointments.  ?

## 2021-11-11 ENCOUNTER — Other Ambulatory Visit (HOSPITAL_COMMUNITY): Payer: Self-pay

## 2021-11-23 ENCOUNTER — Ambulatory Visit (INDEPENDENT_AMBULATORY_CARE_PROVIDER_SITE_OTHER): Payer: 59

## 2021-11-23 DIAGNOSIS — I5022 Chronic systolic (congestive) heart failure: Secondary | ICD-10-CM | POA: Diagnosis not present

## 2021-11-23 DIAGNOSIS — I428 Other cardiomyopathies: Secondary | ICD-10-CM

## 2021-11-24 LAB — CUP PACEART REMOTE DEVICE CHECK
Battery Remaining Longevity: 24 mo
Battery Remaining Percentage: 28 %
Battery Voltage: 2.86 V
Brady Statistic AP VP Percent: 9.7 %
Brady Statistic AP VS Percent: 1 %
Brady Statistic AS VP Percent: 89 %
Brady Statistic AS VS Percent: 1.5 %
Brady Statistic RA Percent Paced: 9.4 %
Date Time Interrogation Session: 20230425035719
HighPow Impedance: 91 Ohm
HighPow Impedance: 91 Ohm
Implantable Lead Implant Date: 20170512
Implantable Lead Implant Date: 20170512
Implantable Lead Implant Date: 20170512
Implantable Lead Location: 753858
Implantable Lead Location: 753859
Implantable Lead Location: 753860
Implantable Lead Model: 7122
Implantable Pulse Generator Implant Date: 20170512
Lead Channel Impedance Value: 1050 Ohm
Lead Channel Impedance Value: 410 Ohm
Lead Channel Impedance Value: 600 Ohm
Lead Channel Pacing Threshold Amplitude: 0.75 V
Lead Channel Pacing Threshold Amplitude: 1 V
Lead Channel Pacing Threshold Amplitude: 1.25 V
Lead Channel Pacing Threshold Pulse Width: 0.5 ms
Lead Channel Pacing Threshold Pulse Width: 0.5 ms
Lead Channel Pacing Threshold Pulse Width: 1 ms
Lead Channel Sensing Intrinsic Amplitude: 12 mV
Lead Channel Sensing Intrinsic Amplitude: 5 mV
Lead Channel Setting Pacing Amplitude: 2 V
Lead Channel Setting Pacing Amplitude: 2.25 V
Lead Channel Setting Pacing Amplitude: 2.25 V
Lead Channel Setting Pacing Pulse Width: 0.5 ms
Lead Channel Setting Pacing Pulse Width: 1 ms
Lead Channel Setting Sensing Sensitivity: 0.5 mV
Pulse Gen Serial Number: 7355027

## 2021-11-30 ENCOUNTER — Other Ambulatory Visit (HOSPITAL_COMMUNITY): Payer: Self-pay | Admitting: Pharmacist

## 2021-11-30 ENCOUNTER — Other Ambulatory Visit (HOSPITAL_COMMUNITY): Payer: Self-pay

## 2021-11-30 MED ORDER — EMPAGLIFLOZIN 10 MG PO TABS
10.0000 mg | ORAL_TABLET | Freq: Every day | ORAL | 11 refills | Status: DC
  Filled 2021-11-30: qty 30, 30d supply, fill #0
  Filled 2022-01-10 – 2022-01-11 (×3): qty 30, 30d supply, fill #1
  Filled 2022-02-16: qty 30, 30d supply, fill #2
  Filled 2022-03-25 – 2022-03-28 (×4): qty 30, 30d supply, fill #3
  Filled 2022-04-27: qty 30, 30d supply, fill #4
  Filled 2022-06-01: qty 30, 30d supply, fill #5
  Filled 2022-07-04: qty 30, 30d supply, fill #6
  Filled 2022-08-08: qty 30, 30d supply, fill #7
  Filled 2022-09-13: qty 30, 30d supply, fill #8
  Filled 2022-10-17: qty 30, 30d supply, fill #9
  Filled 2022-11-16: qty 30, 30d supply, fill #10

## 2021-12-08 ENCOUNTER — Other Ambulatory Visit (HOSPITAL_COMMUNITY): Payer: Self-pay

## 2021-12-09 NOTE — Progress Notes (Signed)
Remote ICD transmission.   

## 2021-12-10 ENCOUNTER — Other Ambulatory Visit (HOSPITAL_COMMUNITY): Payer: Self-pay

## 2022-01-10 ENCOUNTER — Other Ambulatory Visit (HOSPITAL_COMMUNITY): Payer: Self-pay

## 2022-01-11 ENCOUNTER — Other Ambulatory Visit: Payer: Self-pay

## 2022-01-11 ENCOUNTER — Other Ambulatory Visit (HOSPITAL_COMMUNITY): Payer: Self-pay

## 2022-01-11 ENCOUNTER — Telehealth (HOSPITAL_COMMUNITY): Payer: Self-pay | Admitting: Pharmacy Technician

## 2022-01-11 NOTE — Telephone Encounter (Signed)
Advanced Heart Failure Patient Advocate Encounter  Prior Authorization for London Pepper has been submitted and approved through [employer].  PA# [id number] Effective dates: 12/12/21 through 01/11/23  Patients co-pay is $40 (30 days, co-pay card took co-pay to $43)  Insurance states that future refills may be higher. My guess is that they want the patient to use [facility] mail order pharmacy. Advised the patient to reach out to insurance for further information.   Archer Asa, CPhT

## 2022-02-16 ENCOUNTER — Other Ambulatory Visit (HOSPITAL_COMMUNITY): Payer: Self-pay

## 2022-02-22 ENCOUNTER — Ambulatory Visit (INDEPENDENT_AMBULATORY_CARE_PROVIDER_SITE_OTHER): Payer: BC Managed Care – PPO

## 2022-02-22 DIAGNOSIS — I428 Other cardiomyopathies: Secondary | ICD-10-CM

## 2022-02-23 LAB — CUP PACEART REMOTE DEVICE CHECK
Battery Remaining Longevity: 23 mo
Battery Remaining Percentage: 28 %
Battery Voltage: 2.84 V
Brady Statistic AP VP Percent: 12 %
Brady Statistic AP VS Percent: 1 %
Brady Statistic AS VP Percent: 86 %
Brady Statistic AS VS Percent: 1.6 %
Brady Statistic RA Percent Paced: 12 %
Date Time Interrogation Session: 20230725020017
HighPow Impedance: 83 Ohm
HighPow Impedance: 83 Ohm
Implantable Lead Implant Date: 20170512
Implantable Lead Implant Date: 20170512
Implantable Lead Implant Date: 20170512
Implantable Lead Location: 753858
Implantable Lead Location: 753859
Implantable Lead Location: 753860
Implantable Lead Model: 7122
Implantable Pulse Generator Implant Date: 20170512
Lead Channel Impedance Value: 1100 Ohm
Lead Channel Impedance Value: 400 Ohm
Lead Channel Impedance Value: 540 Ohm
Lead Channel Pacing Threshold Amplitude: 0.75 V
Lead Channel Pacing Threshold Amplitude: 1 V
Lead Channel Pacing Threshold Amplitude: 1.25 V
Lead Channel Pacing Threshold Pulse Width: 0.5 ms
Lead Channel Pacing Threshold Pulse Width: 0.5 ms
Lead Channel Pacing Threshold Pulse Width: 1 ms
Lead Channel Sensing Intrinsic Amplitude: 12 mV
Lead Channel Sensing Intrinsic Amplitude: 5 mV
Lead Channel Setting Pacing Amplitude: 2 V
Lead Channel Setting Pacing Amplitude: 2.25 V
Lead Channel Setting Pacing Amplitude: 2.25 V
Lead Channel Setting Pacing Pulse Width: 0.5 ms
Lead Channel Setting Pacing Pulse Width: 1 ms
Lead Channel Setting Sensing Sensitivity: 0.5 mV
Pulse Gen Serial Number: 7355027

## 2022-03-24 NOTE — Progress Notes (Signed)
Remote ICD transmission.   

## 2022-03-25 ENCOUNTER — Other Ambulatory Visit (HOSPITAL_COMMUNITY): Payer: Self-pay

## 2022-03-25 ENCOUNTER — Telehealth (HOSPITAL_COMMUNITY): Payer: Self-pay | Admitting: Pharmacy Technician

## 2022-03-25 NOTE — Telephone Encounter (Signed)
Advanced Heart Failure Patient Advocate Encounter  Called and spoke with patient regarding Jardiance. Insurance is not paying anything towards the co-pay. When I investigated the claim, insurance is stating that the patient must call the referral line. Provided the patient member number and advised him to call back if we need to send the script to another pharmacy (if insurance requires).  Archer Asa, CPhT

## 2022-03-28 ENCOUNTER — Other Ambulatory Visit (HOSPITAL_COMMUNITY): Payer: Self-pay

## 2022-04-27 ENCOUNTER — Other Ambulatory Visit (HOSPITAL_COMMUNITY): Payer: Self-pay

## 2022-05-24 ENCOUNTER — Ambulatory Visit (INDEPENDENT_AMBULATORY_CARE_PROVIDER_SITE_OTHER): Payer: BC Managed Care – PPO

## 2022-05-24 DIAGNOSIS — I428 Other cardiomyopathies: Secondary | ICD-10-CM

## 2022-05-26 LAB — CUP PACEART REMOTE DEVICE CHECK
Battery Remaining Longevity: 22 mo
Battery Remaining Percentage: 27 %
Battery Voltage: 2.83 V
Brady Statistic AP VP Percent: 13 %
Brady Statistic AP VS Percent: 1 %
Brady Statistic AS VP Percent: 85 %
Brady Statistic AS VS Percent: 1.6 %
Brady Statistic RA Percent Paced: 12 %
Date Time Interrogation Session: 20231024020015
HighPow Impedance: 81 Ohm
HighPow Impedance: 81 Ohm
Implantable Lead Connection Status: 753985
Implantable Lead Connection Status: 753985
Implantable Lead Connection Status: 753985
Implantable Lead Implant Date: 20170512
Implantable Lead Implant Date: 20170512
Implantable Lead Implant Date: 20170512
Implantable Lead Location: 753858
Implantable Lead Location: 753859
Implantable Lead Location: 753860
Implantable Lead Model: 7122
Implantable Pulse Generator Implant Date: 20170512
Lead Channel Impedance Value: 1050 Ohm
Lead Channel Impedance Value: 400 Ohm
Lead Channel Impedance Value: 600 Ohm
Lead Channel Pacing Threshold Amplitude: 0.75 V
Lead Channel Pacing Threshold Amplitude: 1 V
Lead Channel Pacing Threshold Amplitude: 1.125 V
Lead Channel Pacing Threshold Pulse Width: 0.5 ms
Lead Channel Pacing Threshold Pulse Width: 0.5 ms
Lead Channel Pacing Threshold Pulse Width: 1 ms
Lead Channel Sensing Intrinsic Amplitude: 12 mV
Lead Channel Sensing Intrinsic Amplitude: 5 mV
Lead Channel Setting Pacing Amplitude: 2 V
Lead Channel Setting Pacing Amplitude: 2.125
Lead Channel Setting Pacing Amplitude: 2.25 V
Lead Channel Setting Pacing Pulse Width: 0.5 ms
Lead Channel Setting Pacing Pulse Width: 1 ms
Lead Channel Setting Sensing Sensitivity: 0.5 mV
Pulse Gen Serial Number: 7355027

## 2022-06-01 ENCOUNTER — Other Ambulatory Visit (HOSPITAL_COMMUNITY): Payer: Self-pay

## 2022-06-13 NOTE — Progress Notes (Signed)
Remote ICD transmission.   

## 2022-07-04 ENCOUNTER — Other Ambulatory Visit (HOSPITAL_COMMUNITY): Payer: Self-pay

## 2022-08-08 ENCOUNTER — Other Ambulatory Visit: Payer: Self-pay

## 2022-08-08 ENCOUNTER — Other Ambulatory Visit (HOSPITAL_COMMUNITY): Payer: Self-pay

## 2022-08-23 ENCOUNTER — Ambulatory Visit: Payer: Self-pay | Attending: Internal Medicine

## 2022-08-23 DIAGNOSIS — I428 Other cardiomyopathies: Secondary | ICD-10-CM

## 2022-08-23 LAB — CUP PACEART REMOTE DEVICE CHECK
Battery Remaining Longevity: 19 mo
Battery Remaining Percentage: 23 %
Battery Voltage: 2.8 V
Brady Statistic AP VP Percent: 12 %
Brady Statistic AP VS Percent: 1 %
Brady Statistic AS VP Percent: 86 %
Brady Statistic AS VS Percent: 1.6 %
Brady Statistic RA Percent Paced: 11 %
Date Time Interrogation Session: 20240123020016
HighPow Impedance: 86 Ohm
HighPow Impedance: 86 Ohm
Implantable Lead Connection Status: 753985
Implantable Lead Connection Status: 753985
Implantable Lead Connection Status: 753985
Implantable Lead Implant Date: 20170512
Implantable Lead Implant Date: 20170512
Implantable Lead Implant Date: 20170512
Implantable Lead Location: 753858
Implantable Lead Location: 753859
Implantable Lead Location: 753860
Implantable Lead Model: 7122
Implantable Pulse Generator Implant Date: 20170512
Lead Channel Impedance Value: 1100 Ohm
Lead Channel Impedance Value: 410 Ohm
Lead Channel Impedance Value: 550 Ohm
Lead Channel Pacing Threshold Amplitude: 0.75 V
Lead Channel Pacing Threshold Amplitude: 1 V
Lead Channel Pacing Threshold Amplitude: 1.125 V
Lead Channel Pacing Threshold Pulse Width: 0.5 ms
Lead Channel Pacing Threshold Pulse Width: 0.5 ms
Lead Channel Pacing Threshold Pulse Width: 1 ms
Lead Channel Sensing Intrinsic Amplitude: 12 mV
Lead Channel Sensing Intrinsic Amplitude: 5 mV
Lead Channel Setting Pacing Amplitude: 2 V
Lead Channel Setting Pacing Amplitude: 2.125
Lead Channel Setting Pacing Amplitude: 2.25 V
Lead Channel Setting Pacing Pulse Width: 0.5 ms
Lead Channel Setting Pacing Pulse Width: 1 ms
Lead Channel Setting Sensing Sensitivity: 0.5 mV
Pulse Gen Serial Number: 7355027

## 2022-09-13 ENCOUNTER — Other Ambulatory Visit (HOSPITAL_COMMUNITY): Payer: Self-pay

## 2022-09-20 NOTE — Progress Notes (Signed)
Remote ICD transmission.   

## 2022-10-17 ENCOUNTER — Other Ambulatory Visit (HOSPITAL_COMMUNITY): Payer: Self-pay

## 2022-11-14 ENCOUNTER — Other Ambulatory Visit (HOSPITAL_COMMUNITY): Payer: Self-pay | Admitting: Cardiology

## 2022-11-16 ENCOUNTER — Other Ambulatory Visit (HOSPITAL_COMMUNITY): Payer: Self-pay

## 2022-11-22 ENCOUNTER — Ambulatory Visit (INDEPENDENT_AMBULATORY_CARE_PROVIDER_SITE_OTHER): Payer: 59

## 2022-11-22 DIAGNOSIS — I428 Other cardiomyopathies: Secondary | ICD-10-CM

## 2022-11-23 LAB — CUP PACEART REMOTE DEVICE CHECK
Battery Remaining Longevity: 18 mo
Battery Remaining Percentage: 22 %
Battery Voltage: 2.78 V
Brady Statistic AP VP Percent: 11 %
Brady Statistic AP VS Percent: 1 %
Brady Statistic AS VP Percent: 87 %
Brady Statistic AS VS Percent: 1.5 %
Brady Statistic RA Percent Paced: 11 %
Date Time Interrogation Session: 20240423020017
HighPow Impedance: 86 Ohm
HighPow Impedance: 86 Ohm
Implantable Lead Connection Status: 753985
Implantable Lead Connection Status: 753985
Implantable Lead Connection Status: 753985
Implantable Lead Implant Date: 20170512
Implantable Lead Implant Date: 20170512
Implantable Lead Implant Date: 20170512
Implantable Lead Location: 753858
Implantable Lead Location: 753859
Implantable Lead Location: 753860
Implantable Lead Model: 7122
Implantable Pulse Generator Implant Date: 20170512
Lead Channel Impedance Value: 1025 Ohm
Lead Channel Impedance Value: 410 Ohm
Lead Channel Impedance Value: 550 Ohm
Lead Channel Pacing Threshold Amplitude: 0.75 V
Lead Channel Pacing Threshold Amplitude: 1 V
Lead Channel Pacing Threshold Amplitude: 1.375 V
Lead Channel Pacing Threshold Pulse Width: 0.5 ms
Lead Channel Pacing Threshold Pulse Width: 0.5 ms
Lead Channel Pacing Threshold Pulse Width: 1 ms
Lead Channel Sensing Intrinsic Amplitude: 12 mV
Lead Channel Sensing Intrinsic Amplitude: 5 mV
Lead Channel Setting Pacing Amplitude: 2 V
Lead Channel Setting Pacing Amplitude: 2.25 V
Lead Channel Setting Pacing Amplitude: 2.375
Lead Channel Setting Pacing Pulse Width: 0.5 ms
Lead Channel Setting Pacing Pulse Width: 1 ms
Lead Channel Setting Sensing Sensitivity: 0.5 mV
Pulse Gen Serial Number: 7355027

## 2022-11-25 ENCOUNTER — Telehealth (HOSPITAL_COMMUNITY): Payer: Self-pay | Admitting: Cardiology

## 2022-11-25 NOTE — Telephone Encounter (Signed)
Spoke with Mrs. Tankard, stated patient had a Varicose vein burst this morning and had to go to ED. Patient was hypotensive and HB dropped. He is going home now and having repeat labs in the morning.  Advised wife to check BP in the morning before giving morning meds and if his BP is low to call the office and let us know.

## 2022-11-25 NOTE — Telephone Encounter (Signed)
please call wife [phone], pt is currently in the ER would like to speak with triage asap. Thanks

## 2022-12-11 ENCOUNTER — Other Ambulatory Visit (HOSPITAL_COMMUNITY): Payer: Self-pay | Admitting: Cardiology

## 2022-12-20 ENCOUNTER — Other Ambulatory Visit (HOSPITAL_COMMUNITY): Payer: Self-pay | Admitting: Cardiology

## 2022-12-20 ENCOUNTER — Other Ambulatory Visit (HOSPITAL_COMMUNITY): Payer: Self-pay

## 2022-12-20 ENCOUNTER — Other Ambulatory Visit: Payer: Self-pay

## 2022-12-20 MED ORDER — EMPAGLIFLOZIN 10 MG PO TABS
10.0000 mg | ORAL_TABLET | Freq: Every day | ORAL | 11 refills | Status: DC
Start: 2022-12-20 — End: 2023-06-07
  Filled 2022-12-20: qty 30, 30d supply, fill #0
  Filled 2023-01-19: qty 30, 30d supply, fill #1
  Filled 2023-02-20: qty 30, 30d supply, fill #2
  Filled 2023-03-22: qty 30, 30d supply, fill #3
  Filled 2023-04-24: qty 30, 30d supply, fill #4
  Filled 2023-05-25: qty 30, 30d supply, fill #5

## 2022-12-21 ENCOUNTER — Other Ambulatory Visit (HOSPITAL_COMMUNITY): Payer: Self-pay

## 2022-12-21 NOTE — Progress Notes (Signed)
Remote ICD transmission.   

## 2023-01-13 ENCOUNTER — Other Ambulatory Visit (HOSPITAL_COMMUNITY): Payer: Self-pay | Admitting: Cardiology

## 2023-01-19 ENCOUNTER — Other Ambulatory Visit (HOSPITAL_COMMUNITY): Payer: Self-pay

## 2023-02-20 ENCOUNTER — Other Ambulatory Visit (HOSPITAL_COMMUNITY): Payer: Self-pay

## 2023-02-21 ENCOUNTER — Ambulatory Visit (INDEPENDENT_AMBULATORY_CARE_PROVIDER_SITE_OTHER): Payer: 59

## 2023-02-21 DIAGNOSIS — I428 Other cardiomyopathies: Secondary | ICD-10-CM | POA: Diagnosis not present

## 2023-02-23 LAB — CUP PACEART REMOTE DEVICE CHECK
Battery Remaining Longevity: 13 mo
Battery Remaining Percentage: 16 %
Battery Voltage: 2.74 V
Brady Statistic AP VP Percent: 12 %
Brady Statistic AP VS Percent: 1 %
Brady Statistic AS VP Percent: 87 %
Brady Statistic RA Percent Paced: 11 %
Date Time Interrogation Session: 20240723023545
HighPow Impedance: 72 Ohm
HighPow Impedance: 72 Ohm
Implantable Lead Connection Status: 753985
Implantable Lead Connection Status: 753985
Implantable Lead Connection Status: 753985
Implantable Lead Implant Date: 20170512
Implantable Lead Implant Date: 20170512
Implantable Lead Implant Date: 20170512
Implantable Lead Location: 753858
Implantable Lead Location: 753860
Implantable Lead Model: 7122
Implantable Pulse Generator Implant Date: 20170512
Lead Channel Impedance Value: 1075 Ohm
Lead Channel Impedance Value: 400 Ohm
Lead Channel Impedance Value: 590 Ohm
Lead Channel Pacing Threshold Amplitude: 0.75 V
Lead Channel Pacing Threshold Amplitude: 1 V
Lead Channel Pacing Threshold Amplitude: 1.25 V
Lead Channel Pacing Threshold Pulse Width: 0.5 ms
Lead Channel Pacing Threshold Pulse Width: 0.5 ms
Lead Channel Sensing Intrinsic Amplitude: 12 mV
Lead Channel Sensing Intrinsic Amplitude: 5 mV
Lead Channel Setting Pacing Amplitude: 2 V
Lead Channel Setting Pacing Amplitude: 2.25 V
Lead Channel Setting Pacing Amplitude: 2.25 V
Lead Channel Setting Pacing Pulse Width: 0.5 ms
Lead Channel Setting Pacing Pulse Width: 1 ms
Pulse Gen Serial Number: 7355027

## 2023-03-08 ENCOUNTER — Other Ambulatory Visit (HOSPITAL_COMMUNITY): Payer: Self-pay | Admitting: Cardiology

## 2023-03-09 NOTE — Progress Notes (Signed)
Remote ICD transmission.   

## 2023-03-22 ENCOUNTER — Other Ambulatory Visit: Payer: Self-pay

## 2023-04-24 ENCOUNTER — Other Ambulatory Visit (HOSPITAL_COMMUNITY): Payer: Self-pay

## 2023-05-11 ENCOUNTER — Other Ambulatory Visit (HOSPITAL_COMMUNITY): Payer: Self-pay | Admitting: Cardiology

## 2023-05-23 ENCOUNTER — Ambulatory Visit (INDEPENDENT_AMBULATORY_CARE_PROVIDER_SITE_OTHER): Payer: 59

## 2023-05-23 DIAGNOSIS — I428 Other cardiomyopathies: Secondary | ICD-10-CM

## 2023-05-25 ENCOUNTER — Other Ambulatory Visit: Payer: Self-pay

## 2023-05-25 LAB — CUP PACEART REMOTE DEVICE CHECK
Battery Remaining Longevity: 10 mo
Battery Remaining Percentage: 11 %
Battery Voltage: 2.69 V
Brady Statistic AP VP Percent: 12 %
Brady Statistic AP VS Percent: 1 %
Brady Statistic AS VP Percent: 86 %
Brady Statistic AS VS Percent: 1.2 %
Brady Statistic RA Percent Paced: 11 %
Date Time Interrogation Session: 20241022020015
HighPow Impedance: 71 Ohm
HighPow Impedance: 71 Ohm
Implantable Lead Connection Status: 753985
Implantable Lead Connection Status: 753985
Implantable Lead Connection Status: 753985
Implantable Lead Implant Date: 20170512
Implantable Lead Implant Date: 20170512
Implantable Lead Implant Date: 20170512
Implantable Lead Location: 753858
Implantable Lead Location: 753859
Implantable Lead Location: 753860
Implantable Lead Model: 7122
Implantable Pulse Generator Implant Date: 20170512
Lead Channel Impedance Value: 1100 Ohm
Lead Channel Impedance Value: 530 Ohm
Lead Channel Impedance Value: 640 Ohm
Lead Channel Pacing Threshold Amplitude: 0.75 V
Lead Channel Pacing Threshold Amplitude: 1 V
Lead Channel Pacing Threshold Amplitude: 1.25 V
Lead Channel Pacing Threshold Pulse Width: 0.5 ms
Lead Channel Pacing Threshold Pulse Width: 0.5 ms
Lead Channel Pacing Threshold Pulse Width: 1 ms
Lead Channel Sensing Intrinsic Amplitude: 12 mV
Lead Channel Sensing Intrinsic Amplitude: 5 mV
Lead Channel Setting Pacing Amplitude: 2 V
Lead Channel Setting Pacing Amplitude: 2.25 V
Lead Channel Setting Pacing Amplitude: 2.25 V
Lead Channel Setting Pacing Pulse Width: 0.5 ms
Lead Channel Setting Pacing Pulse Width: 1 ms
Lead Channel Setting Sensing Sensitivity: 0.5 mV
Pulse Gen Serial Number: 7355027

## 2023-06-07 ENCOUNTER — Other Ambulatory Visit (HOSPITAL_COMMUNITY): Payer: Self-pay | Admitting: Cardiology

## 2023-06-07 MED ORDER — CARVEDILOL 25 MG PO TABS: 25.0000 mg | ORAL_TABLET | Freq: Two times a day (BID) | ORAL | 0 refills | Status: DC

## 2023-06-07 MED ORDER — ENTRESTO 97-103 MG PO TABS: 1.0000 | ORAL_TABLET | Freq: Two times a day (BID) | ORAL | 0 refills | Status: DC

## 2023-06-07 MED ORDER — EMPAGLIFLOZIN 10 MG PO TABS
10.0000 mg | ORAL_TABLET | Freq: Every day | ORAL | 0 refills | Status: DC
Start: 2023-06-07 — End: 2023-07-12
  Filled 2023-06-30: qty 30, 30d supply, fill #0

## 2023-06-07 MED ORDER — SPIRONOLACTONE 25 MG PO TABS: 25.0000 mg | ORAL_TABLET | Freq: Every day | ORAL | 0 refills | Status: DC

## 2023-06-12 NOTE — Progress Notes (Signed)
Remote ICD transmission.   

## 2023-06-26 ENCOUNTER — Other Ambulatory Visit (HOSPITAL_COMMUNITY): Payer: Self-pay

## 2023-06-26 ENCOUNTER — Other Ambulatory Visit (HOSPITAL_COMMUNITY): Payer: Self-pay | Admitting: Cardiology

## 2023-06-28 ENCOUNTER — Other Ambulatory Visit (HOSPITAL_COMMUNITY): Payer: Self-pay

## 2023-06-29 ENCOUNTER — Other Ambulatory Visit (HOSPITAL_COMMUNITY): Payer: Self-pay | Admitting: Cardiology

## 2023-06-30 ENCOUNTER — Other Ambulatory Visit (HOSPITAL_COMMUNITY): Payer: Self-pay

## 2023-07-01 ENCOUNTER — Other Ambulatory Visit (HOSPITAL_COMMUNITY): Payer: Self-pay

## 2023-07-03 ENCOUNTER — Other Ambulatory Visit (HOSPITAL_COMMUNITY): Payer: Self-pay

## 2023-07-10 NOTE — Progress Notes (Signed)
Patient ID: Ethan York, male   DOB: Apr 23, 1976, 47 y.o.   MRN: [id number]  PCP: Dr. Tommi Rumps [facility]) Cardiology: Dr. Shirlee Latch  48 y.o. with history of HTN and OSA was admitted to [facility] in 11/16 with several weeks of progressive dyspnea and was found to have acute systolic CHF with EF 20-25% by echo.  No chest pain.  He was diuresed and had left and right heart cath, showing preserved cardiac output with elevated filling pressures and no significant CAD.  Echo 3/17 with persistently decreased EF, 25%.  CPX 4/17 with moderate functional limitation due to heart failure.  He had St Jude CRT-D placed in 5/17.  Echo 10/18 showed EF 30-35% with diffuse hypokinesis.  Echo in 10/19 showed EF 50-55%, aortic root 4.4 cm. CTA chest in 1/20 showed no ascending aorta or root aneurysm. Echo in 11/20 with EF up to 55-60%.   Echo was done today and reviewed, EF 45% with mild LVH and normal RV.   He returns for followup of CHF.  Weight is down about 30 lbs.  He has been watching his diet more closely. BP is elevated today.  He has not been taking Coreg for about a year now.  No dyspnea with normal activities, he does get short of breath with heavy lifting.  No orthopnea/PND.  No chest pain.  No lightheadedness.   St Jude device interrogation: >99% BiV pacing, stable thoracic impedance.    Labs (11/16): K 3.3, creatinine 1.19, LDL 84, BNP 884, TSH normal Labs (12/16): K 4.3, creatinine 1.06, BNP 123 Labs (07/2015): K 4.8 Creatinine 1.04  Labs (09/2015): Creatinine 1.26  Labs (5/17): K 4.1, creatinine 0.96 Labs (9/18): TSH normal, K 3.8, creatihine 1.03, hgb 15.4 Labs (9/19): K 4.4, creatinine 1.05 => 1.02 => 0.9, hgb 14.3, LDL 95 Labs (1/20): LDL 95 Labs (10/20): LDL 94, TGs 172, K 3.6, creatinine 1.08 Labs (10/22): K 3.9, creatinine 0.99, hgb 16.6  ECG (personally reviewed): NSR, BiV pacing  PMH: 1. HTN 2. OSA: Using CPAP   3. GERD 4. Chronic systolic CHF: Nonischemic cardiomyopathy.   - Echo  (11/16) with EF 20-25%, diffuse hypokinesis, mild-moderate MR, PASP 35 mmHg, severe LAE.  - LHC/RHC (11/16) with no significant CAD; mean RA 9, PA 50/29 mean 37, mean PCWP 22, CI 2.47, PVR 2.13, LVEDP 30.  - Unable to fit for cardiac MRI. - Echo (3/17) with EF 25%, septal-lateral dyssynchrony, mild to moderately dilated LV, normal RV size and systolic function.  - CPX (4/17): peak VO2 16.6 (54% predicted), VE/VCO2 slope 25, RER 1.13 => moderate HF limitation.  - 5/17 St Jude CRT-D placed.  - Echo (10/18): EF 30-35%, diffuse hypokinesis, normal RV size and systolic function.  - Echo (10/19): EF 50-55%, aortic root 4.4 cm.  - CPX (10/19): Peak VO2 16.5, VE/CO2 26, RER 1.15 => moderate functional impairment, likely due to weight/body habitus.   - Echo (11/20): EF 55-60%, normal RV.  - Echo (3/23): EF 45%, mild LVH, normal RV.  5. LBBB 6. Active smoker 7. Dilated aortic root: 4.4 cm on 10/19 echo.  - CTA chest (1/20) did not show dilated root or ascending aorta.  8. Venous insufficiency.   FH: - Brother with MI at 93 - Mother with MI in her 74s - No FH of cardiomyopathy.   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  He smoked 2.5 ppd, now down to 4 cigs/day.  Occasional  ETOH.   ROS: All systems reviewed and negative except as per HPI.   Current Outpatient Medications  Medication Sig Dispense Refill   Aspirin-Acetaminophen 500-325 MG PACK Take 1 packet by mouth daily as needed (pain).      carvedilol (COREG) 25 MG tablet TAKE 1 TABLET (25 MG TOTAL) BY MOUTH 2 (TWO) TIMES DAILY. MUST KEEP 12/11 OV FOR ADDITIONAL REFILLS 180 tablet 3   empagliflozin (JARDIANCE) 10 MG TABS tablet Take 1 tablet by mouth daily before breakfast. 30 tablet 0   furosemide (LASIX) 20 MG tablet TAKE 1 TABLET BY MOUTH DAILY AS NEEDED (FOR SEVERE EDEMA). PLEASE SCHEDULE AN APPOINTMENT 30 tablet 0   multivitamin (ONE-A-DAY MEN'S) TABS tablet Take 1 tablet by mouth  daily.     sacubitril-valsartan (ENTRESTO) 97-103 MG Take 1 tablet by mouth 2 (two) times daily. Must keep 12/11 ov for additional refills 60 tablet 0   sildenafil (VIAGRA) 100 MG tablet Take 1 tablet (100 mg total) by mouth daily as needed for erectile dysfunction. 10 tablet 3   spironolactone (ALDACTONE) 25 MG tablet Take 1 tablet (25 mg total) by mouth daily. Must keep 12/11 ov for additional refills 30 tablet 0   No current facility-administered medications for this visit.   There were no vitals taken for this visit. General: NAD Neck: No JVD, no thyromegaly or thyroid nodule.  Lungs: Clear to auscultation bilaterally with normal respiratory effort. CV: Nondisplaced PMI.  Heart regular S1/S2, no S3/S4, no murmur.  Trace ankle edema.  No carotid bruit.  Normal pedal pulses.  Abdomen: Soft, nontender, no hepatosplenomegaly, no distention.  Skin: Intact without lesions or rashes.  Neurologic: Alert and oriented x 3.  Psych: Normal affect. Extremities: No clubbing or cyanosis. Venous varicosities.  HEENT: Normal.   Assessment/Plan: 1. Chronic systolic CHF: Nonischemic cardiomyopathy.  EF 20-25% on 11/16 echo, repeat echo in 3/17 with EF 25%, septal-lateral dyssynchrony.  Unable to fit in MRI.  Cause of cardiomyopathy still uncertain.  No significant CAD on coronary angiography.  Has family history of CAD but not cardiomyopathy.  Possible viral myocarditis, also consider LBBB cardiomyopathy. He now has Secondary school teacher CRT-D device.  Echo 10/18 with EF 30-35%. Most recent echo in 11/20 showed EF up to 55-60%.  Echo today showed EF down to 45%.  He has not been taking Coreg for about a year and BP is high.  NYHA class I-II symptoms.  He is not volume overloaded on exam or by Corvue.  - Continue Entresto 97/103 bid.  - Restart Coreg at 12.5 mg bid.  - Continue spironolactone 25 mg daily.  - Stop HCTZ, start Farxiga vs Jardiance 10 mg daily.  BMET/BNP today, BMET in a few weeks.    - Will repeat echo in  6 months or so after titrating back up Coreg.  - He has Lasix 20 mg to use prn.  2. Smoking: Working on quitting. 3. OSA: Continue nightly CPAP.  4. Obesity: Weight trending down.  5.  HTN: Adding back Coreg as above.   Followup with HF pharmacist in 1 month to continue to titrate up Coreg.  See me in 4 months.   Anderson Malta [facility] 07/10/2023

## 2023-07-12 ENCOUNTER — Encounter (HOSPITAL_COMMUNITY): Payer: Self-pay

## 2023-07-12 ENCOUNTER — Other Ambulatory Visit (HOSPITAL_COMMUNITY): Payer: Self-pay

## 2023-07-12 ENCOUNTER — Ambulatory Visit (HOSPITAL_COMMUNITY)
Admission: RE | Admit: 2023-07-12 | Discharge: 2023-07-12 | Disposition: A | Discharge status: Home / Self Care | Payer: 59 | Source: Ambulatory Visit | Attending: Family Medicine | Admitting: Family Medicine

## 2023-07-12 VITALS — BP 156/98 | HR 68 | Ht 79.0 in | Wt 329.6 lb

## 2023-07-12 DIAGNOSIS — G4733 Obstructive sleep apnea (adult) (pediatric): Secondary | ICD-10-CM

## 2023-07-12 DIAGNOSIS — I428 Other cardiomyopathies: Secondary | ICD-10-CM | POA: Diagnosis not present

## 2023-07-12 DIAGNOSIS — I10 Essential (primary) hypertension: Secondary | ICD-10-CM | POA: Diagnosis present

## 2023-07-12 DIAGNOSIS — Z6837 Body mass index (BMI) 37.0-37.9, adult: Secondary | ICD-10-CM | POA: Diagnosis not present

## 2023-07-12 DIAGNOSIS — F17210 Nicotine dependence, cigarettes, uncomplicated: Secondary | ICD-10-CM | POA: Insufficient documentation

## 2023-07-12 DIAGNOSIS — I110 Hypertensive heart disease with heart failure: Secondary | ICD-10-CM | POA: Diagnosis not present

## 2023-07-12 DIAGNOSIS — R9431 Abnormal electrocardiogram [ECG] [EKG]: Secondary | ICD-10-CM | POA: Diagnosis not present

## 2023-07-12 DIAGNOSIS — IMO0001 Reserved for inherently not codable concepts without codable children: Secondary | ICD-10-CM

## 2023-07-12 DIAGNOSIS — F17200 Nicotine dependence, unspecified, uncomplicated: Secondary | ICD-10-CM

## 2023-07-12 DIAGNOSIS — I5022 Chronic systolic (congestive) heart failure: Secondary | ICD-10-CM | POA: Diagnosis not present

## 2023-07-12 DIAGNOSIS — E669 Obesity, unspecified: Secondary | ICD-10-CM

## 2023-07-12 LAB — COMPREHENSIVE METABOLIC PANEL
ALT: 20 U/L (ref 0–44)
AST: 17 U/L (ref 15–41)
Albumin: 3.7 g/dL (ref 3.5–5.0)
Alkaline Phosphatase: 66 U/L (ref 38–126)
Anion gap: 5 (ref 5–15)
BUN: 10 mg/dL (ref 6–20)
CO2: 26 mmol/L (ref 22–32)
Calcium: 9.3 mg/dL (ref 8.9–10.3)
Chloride: 103 mmol/L (ref 98–111)
Creatinine, Ser: 0.88 mg/dL (ref 0.61–1.24)
GFR, Estimated: >60 mL/min (ref >60)
Glucose, Bld: 103 mg/dL — ABNORMAL HIGH (ref 70–99)
Potassium: 3.6 mmol/L (ref 3.5–5.1)
Sodium: 134 mmol/L — ABNORMAL LOW (ref 135–145)
Total Bilirubin: 0.7 mg/dL (ref <1.2)
Total Protein: 6.9 g/dL (ref 6.5–8.1)

## 2023-07-12 LAB — CBC
HCT: 47.3 % (ref 39.0–52.0)
Hemoglobin: 15.9 g/dL (ref 13.0–17.0)
MCH: 30.8 pg (ref 26.0–34.0)
MCHC: 33.6 g/dL (ref 30.0–36.0)
MCV: 91.7 fL (ref 80.0–100.0)
Platelets: 253 10*3/uL (ref 150–400)
RBC: 5.16 MIL/uL (ref 4.22–5.81)
RDW: 12.7 % (ref 11.5–15.5)
WBC: 9.7 10*3/uL (ref 4.0–10.5)
nRBC: 0 % (ref 0.0–0.2)

## 2023-07-12 LAB — LIPID PANEL
Cholesterol: 155 mg/dL (ref 0–200)
HDL: 29 mg/dL — ABNORMAL LOW (ref >40)
LDL Cholesterol: 104 mg/dL — ABNORMAL HIGH (ref 0–99)
Total CHOL/HDL Ratio: 5.3 {ratio}
Triglycerides: 108 mg/dL (ref <150)
VLDL: 22 mg/dL (ref 0–40)

## 2023-07-12 LAB — BRAIN NATRIURETIC PEPTIDE: B Natriuretic Peptide: 113.4 pg/mL — ABNORMAL HIGH (ref 0.0–100.0)

## 2023-07-12 MED ORDER — EMPAGLIFLOZIN 10 MG PO TABS
10.0000 mg | ORAL_TABLET | Freq: Every day | ORAL | 3 refills | Status: DC
  Filled 2023-07-12: qty 90, 90d supply, fill #0

## 2023-07-12 MED ORDER — FUROSEMIDE 20 MG PO TABS: 20.0000 mg | ORAL_TABLET | ORAL | 6 refills | Status: AC | PRN

## 2023-07-12 MED ORDER — EMPAGLIFLOZIN 10 MG PO TABS: 10.0000 mg | ORAL_TABLET | Freq: Every day | ORAL | 3 refills | Status: DC

## 2023-07-12 MED ORDER — EMPAGLIFLOZIN 10 MG PO TABS
10.0000 mg | ORAL_TABLET | Freq: Every day | ORAL | 3 refills | Status: DC
  Filled 2023-07-12: qty 90, 90d supply, fill #0
  Filled 2023-08-08: qty 30, 30d supply, fill #0
  Filled 2023-09-08: qty 30, 30d supply, fill #1
  Filled 2023-10-09: qty 30, 30d supply, fill #2
  Filled 2023-11-08: qty 30, 30d supply, fill #3
  Filled 2023-12-03: qty 30, 30d supply, fill #4
  Filled 2024-01-07: qty 30, 30d supply, fill #5
  Filled 2024-02-12: qty 30, 30d supply, fill #6
  Filled 2024-03-11: qty 30, 30d supply, fill #7
  Filled 2024-05-12: qty 30, 30d supply, fill #8
  Filled 2024-06-12: qty 30, 30d supply, fill #9

## 2023-07-12 MED ORDER — SPIRONOLACTONE 50 MG PO TABS: 25.0000 mg | ORAL_TABLET | Freq: Every day | ORAL | 3 refills | Status: DC

## 2023-07-12 MED ORDER — ENTRESTO 97-103 MG PO TABS: 1.0000 | ORAL_TABLET | Freq: Two times a day (BID) | ORAL | 3 refills | Status: AC

## 2023-07-12 MED ORDER — SPIRONOLACTONE 50 MG PO TABS: 50.0000 mg | ORAL_TABLET | Freq: Every day | ORAL | 3 refills | Status: AC

## 2023-07-12 MED ORDER — CARVEDILOL 25 MG PO TABS
25.0000 mg | ORAL_TABLET | Freq: Two times a day (BID) | ORAL | 11 refills | Status: AC
Start: 2023-07-12 — End: ?
  Filled 2023-07-12 – 2024-01-07 (×3): qty 60, 30d supply, fill #0

## 2023-07-12 NOTE — Patient Instructions (Addendum)
Increase Spiro to 50 mg daily - updated Rx sent. Check BP at home and keep a log. Labs today - will call you if abnormal. Check lab in one week at local MD office; Rx provided.  Echocardiogram ordered - see below. Return to see Dr. Shirlee Latch in 8 weeks - see below.  Please call us at [phone] if any questions or concerns.

## 2023-07-17 ENCOUNTER — Telehealth (HOSPITAL_COMMUNITY): Payer: Self-pay | Admitting: *Deleted

## 2023-07-17 DIAGNOSIS — E785 Hyperlipidemia, unspecified: Secondary | ICD-10-CM

## 2023-07-17 DIAGNOSIS — I5022 Chronic systolic (congestive) heart failure: Secondary | ICD-10-CM

## 2023-07-17 MED ORDER — ROSUVASTATIN CALCIUM 10 MG PO TABS: 10.0000 mg | ORAL_TABLET | Freq: Every day | ORAL | 3 refills | Status: AC

## 2023-07-17 NOTE — Telephone Encounter (Signed)
Called patient per Prince Rome, NP with following lab results and instructions:  "Labs stable but lipids elevated. With cardiac history, would like to see LDL goal < 70 to reduce further CV risk  Start Crestor 10 mg daily. Repeat lipids and LFTs in 6-8 weeks."  Pt verbalized understanding. Will add repeat labs on to next clinic visit with Dr. Shirlee Latch.

## 2023-08-03 ENCOUNTER — Encounter: Payer: Self-pay | Admitting: Cardiology

## 2023-08-08 ENCOUNTER — Other Ambulatory Visit (HOSPITAL_COMMUNITY): Payer: Self-pay

## 2023-08-08 ENCOUNTER — Other Ambulatory Visit: Payer: Self-pay

## 2023-08-16 ENCOUNTER — Ambulatory Visit (HOSPITAL_COMMUNITY)
Admission: RE | Admit: 2023-08-16 | Discharge: 2023-08-16 | Disposition: A | Discharge status: Home / Self Care | Payer: 59 | Source: Ambulatory Visit | Attending: Family Medicine | Admitting: Family Medicine

## 2023-08-16 DIAGNOSIS — F17200 Nicotine dependence, unspecified, uncomplicated: Secondary | ICD-10-CM | POA: Diagnosis not present

## 2023-08-16 DIAGNOSIS — I447 Left bundle-branch block, unspecified: Secondary | ICD-10-CM | POA: Insufficient documentation

## 2023-08-16 DIAGNOSIS — G4730 Sleep apnea, unspecified: Secondary | ICD-10-CM | POA: Diagnosis not present

## 2023-08-16 DIAGNOSIS — I5022 Chronic systolic (congestive) heart failure: Secondary | ICD-10-CM | POA: Insufficient documentation

## 2023-08-16 DIAGNOSIS — Z95810 Presence of automatic (implantable) cardiac defibrillator: Secondary | ICD-10-CM | POA: Insufficient documentation

## 2023-08-16 LAB — ECHOCARDIOGRAM COMPLETE
Area-P 1/2: 3.6 cm2
Calc EF: 50.8 %
S' Lateral: 4.4 cm
Single Plane A2C EF: 50.7 %
Single Plane A4C EF: 50 %

## 2023-08-16 NOTE — Progress Notes (Signed)
  Echocardiogram 2D Echocardiogram has been performed.  Ethan York 08/16/2023, 9:40 AM

## 2023-08-22 ENCOUNTER — Ambulatory Visit: Payer: Self-pay

## 2023-08-22 LAB — CUP PACEART REMOTE DEVICE CHECK
Battery Remaining Longevity: 7 mo
Battery Remaining Percentage: 8 %
Battery Voltage: 2.66 V
Brady Statistic AP VP Percent: 11 %
Brady Statistic AP VS Percent: 1 %
Brady Statistic AS VP Percent: 87 %
Brady Statistic AS VS Percent: 1.2 %
Brady Statistic RA Percent Paced: 11 %
Date Time Interrogation Session: 20250121020016
HighPow Impedance: 72 Ohm
HighPow Impedance: 72 Ohm
Implantable Lead Connection Status: 753985
Implantable Lead Connection Status: 753985
Implantable Lead Connection Status: 753985
Implantable Lead Implant Date: 20170512
Implantable Lead Implant Date: 20170512
Implantable Lead Implant Date: 20170512
Implantable Lead Location: 753858
Implantable Lead Location: 753859
Implantable Lead Location: 753860
Implantable Lead Model: 7122
Implantable Pulse Generator Implant Date: 20170512
Lead Channel Impedance Value: 1125 Ohm
Lead Channel Impedance Value: 430 Ohm
Lead Channel Impedance Value: 600 Ohm
Lead Channel Pacing Threshold Amplitude: 0.75 V
Lead Channel Pacing Threshold Amplitude: 1 V
Lead Channel Pacing Threshold Amplitude: 1.25 V
Lead Channel Pacing Threshold Pulse Width: 0.5 ms
Lead Channel Pacing Threshold Pulse Width: 0.5 ms
Lead Channel Pacing Threshold Pulse Width: 1 ms
Lead Channel Sensing Intrinsic Amplitude: 12 mV
Lead Channel Sensing Intrinsic Amplitude: 5 mV
Lead Channel Setting Pacing Amplitude: 2 V
Lead Channel Setting Pacing Amplitude: 2.25 V
Lead Channel Setting Pacing Amplitude: 2.25 V
Lead Channel Setting Pacing Pulse Width: 0.5 ms
Lead Channel Setting Pacing Pulse Width: 1 ms
Lead Channel Setting Sensing Sensitivity: 0.5 mV
Pulse Gen Serial Number: 7355027

## 2023-09-08 ENCOUNTER — Other Ambulatory Visit: Payer: Self-pay

## 2023-09-08 ENCOUNTER — Other Ambulatory Visit (HOSPITAL_COMMUNITY): Payer: Self-pay

## 2023-09-20 ENCOUNTER — Encounter (HOSPITAL_COMMUNITY): Payer: 59 | Admitting: Cardiology

## 2023-09-23 ENCOUNTER — Other Ambulatory Visit (HOSPITAL_COMMUNITY): Payer: Self-pay

## 2023-09-25 ENCOUNTER — Ambulatory Visit (INDEPENDENT_AMBULATORY_CARE_PROVIDER_SITE_OTHER): Payer: 59

## 2023-09-25 DIAGNOSIS — I428 Other cardiomyopathies: Secondary | ICD-10-CM

## 2023-09-25 DIAGNOSIS — I5022 Chronic systolic (congestive) heart failure: Secondary | ICD-10-CM

## 2023-09-25 LAB — CUP PACEART REMOTE DEVICE CHECK
Battery Remaining Longevity: 5 mo
Battery Remaining Percentage: 6 %
Battery Voltage: 2.63 V
Brady Statistic AP VP Percent: 11 %
Brady Statistic AP VS Percent: 1 %
Brady Statistic AS VP Percent: 87 %
Brady Statistic AS VS Percent: 1.1 %
Brady Statistic RA Percent Paced: 11 %
Date Time Interrogation Session: 20250224020017
HighPow Impedance: 80 Ohm
HighPow Impedance: 80 Ohm
Implantable Lead Connection Status: 753985
Implantable Lead Connection Status: 753985
Implantable Lead Connection Status: 753985
Implantable Lead Implant Date: 20170512
Implantable Lead Implant Date: 20170512
Implantable Lead Implant Date: 20170512
Implantable Lead Location: 753858
Implantable Lead Location: 753859
Implantable Lead Location: 753860
Implantable Lead Model: 7122
Implantable Pulse Generator Implant Date: 20170512
Lead Channel Impedance Value: 1125 Ohm
Lead Channel Impedance Value: 430 Ohm
Lead Channel Impedance Value: 600 Ohm
Lead Channel Pacing Threshold Amplitude: 0.75 V
Lead Channel Pacing Threshold Amplitude: 1 V
Lead Channel Pacing Threshold Amplitude: 1.25 V
Lead Channel Pacing Threshold Pulse Width: 0.5 ms
Lead Channel Pacing Threshold Pulse Width: 0.5 ms
Lead Channel Pacing Threshold Pulse Width: 1 ms
Lead Channel Sensing Intrinsic Amplitude: 12 mV
Lead Channel Sensing Intrinsic Amplitude: 5 mV
Lead Channel Setting Pacing Amplitude: 2 V
Lead Channel Setting Pacing Amplitude: 2.25 V
Lead Channel Setting Pacing Amplitude: 2.25 V
Lead Channel Setting Pacing Pulse Width: 0.5 ms
Lead Channel Setting Pacing Pulse Width: 1 ms
Lead Channel Setting Sensing Sensitivity: 0.5 mV
Pulse Gen Serial Number: 7355027

## 2023-09-27 ENCOUNTER — Ambulatory Visit (HOSPITAL_COMMUNITY)
Admission: RE | Admit: 2023-09-27 | Discharge: 2023-09-27 | Disposition: A | Discharge status: Home / Self Care | Payer: 59 | Source: Ambulatory Visit | Attending: Cardiology | Admitting: Cardiology

## 2023-09-27 ENCOUNTER — Encounter (HOSPITAL_COMMUNITY): Payer: Self-pay | Admitting: Cardiology

## 2023-09-27 VITALS — BP 130/80 | HR 70 | Wt 334.0 lb

## 2023-09-27 DIAGNOSIS — I110 Hypertensive heart disease with heart failure: Secondary | ICD-10-CM | POA: Insufficient documentation

## 2023-09-27 DIAGNOSIS — I5022 Chronic systolic (congestive) heart failure: Secondary | ICD-10-CM | POA: Insufficient documentation

## 2023-09-27 DIAGNOSIS — Z7984 Long term (current) use of oral hypoglycemic drugs: Secondary | ICD-10-CM | POA: Diagnosis not present

## 2023-09-27 DIAGNOSIS — I428 Other cardiomyopathies: Secondary | ICD-10-CM | POA: Diagnosis not present

## 2023-09-27 DIAGNOSIS — I83813 Varicose veins of bilateral lower extremities with pain: Secondary | ICD-10-CM | POA: Insufficient documentation

## 2023-09-27 DIAGNOSIS — Z79899 Other long term (current) drug therapy: Secondary | ICD-10-CM | POA: Insufficient documentation

## 2023-09-27 DIAGNOSIS — F17210 Nicotine dependence, cigarettes, uncomplicated: Secondary | ICD-10-CM | POA: Diagnosis not present

## 2023-09-27 DIAGNOSIS — R0683 Snoring: Secondary | ICD-10-CM | POA: Insufficient documentation

## 2023-09-27 DIAGNOSIS — Z6837 Body mass index (BMI) 37.0-37.9, adult: Secondary | ICD-10-CM | POA: Diagnosis not present

## 2023-09-27 DIAGNOSIS — Z8249 Family history of ischemic heart disease and other diseases of the circulatory system: Secondary | ICD-10-CM | POA: Insufficient documentation

## 2023-09-27 DIAGNOSIS — I8390 Asymptomatic varicose veins of unspecified lower extremity: Secondary | ICD-10-CM | POA: Insufficient documentation

## 2023-09-27 DIAGNOSIS — E669 Obesity, unspecified: Secondary | ICD-10-CM | POA: Diagnosis not present

## 2023-09-27 LAB — BASIC METABOLIC PANEL
Anion gap: 10 (ref 5–15)
BUN: 9 mg/dL (ref 6–20)
CO2: 23 mmol/L (ref 22–32)
Calcium: 9.1 mg/dL (ref 8.9–10.3)
Chloride: 103 mmol/L (ref 98–111)
Creatinine, Ser: 0.76 mg/dL (ref 0.61–1.24)
GFR, Estimated: >60 mL/min (ref >60)
Glucose, Bld: 98 mg/dL (ref 70–99)
Potassium: 3.8 mmol/L (ref 3.5–5.1)
Sodium: 136 mmol/L (ref 135–145)

## 2023-09-27 LAB — LIPID PANEL
Cholesterol: 113 mg/dL (ref 0–200)
HDL: 31 mg/dL — ABNORMAL LOW (ref >40)
LDL Cholesterol: 62 mg/dL (ref 0–99)
Total CHOL/HDL Ratio: 3.6 {ratio}
Triglycerides: 100 mg/dL (ref <150)
VLDL: 20 mg/dL (ref 0–40)

## 2023-09-27 LAB — HEPATIC FUNCTION PANEL
ALT: 20 U/L (ref 0–44)
AST: 14 U/L — ABNORMAL LOW (ref 15–41)
Albumin: 3.6 g/dL (ref 3.5–5.0)
Alkaline Phosphatase: 64 U/L (ref 38–126)
Bilirubin, Direct: 0.1 mg/dL (ref 0.0–0.2)
Indirect Bilirubin: 0.5 mg/dL (ref 0.3–0.9)
Total Bilirubin: 0.6 mg/dL (ref 0.0–1.2)
Total Protein: 6.7 g/dL (ref 6.5–8.1)

## 2023-09-27 LAB — BRAIN NATRIURETIC PEPTIDE: B Natriuretic Peptide: 100.2 pg/mL — ABNORMAL HIGH (ref 0.0–100.0)

## 2023-09-27 NOTE — Patient Instructions (Signed)
 There has been no changes to your medications.  Labs done today, your results will be available in MyChart, we will contact you for abnormal readings.  Your provider has recommended that you have a home sleep study ([facility]amar Test).  We have provided you with the equipment in our office today. Please go ahead and download the app. DO NOT OPEN OR TAMPER WITH THE BOX UNTIL WE ADVISE YOU TO DO SO. Once insurance has approved the test our office will call you with PIN number and approval to proceed with testing. Once you have completed the test you just dispose of the equipment, the information is automatically uploaded to [place] via blue-tooth technology. If your test is positive for sleep apnea and you need a home CPAP machine you will be contacted by Dr Norris Cross office [facility]) to set this up.   You have been referred to the Vascular Doctor. They will call you to arrange your appointment.  Your physician recommends that you schedule a follow-up appointment in: 6 months.  If you have any questions or concerns before your next appointment please send [place] a message through [place] or call our office at [phone].    TO LEAVE A MESSAGE FOR THE NURSE SELECT OPTION 2, PLEASE LEAVE A MESSAGE INCLUDING: YOUR NAME DATE OF BIRTH CALL BACK NUMBER REASON FOR CALL**this is important as we prioritize the call backs  YOU WILL RECEIVE A CALL BACK THE SAME DAY AS LONG AS YOU CALL BEFORE 4:00 PM  At the [facility], you and your health needs are our priority. As part of our continuing mission to provide you with exceptional heart care, we have created designated Provider Care Teams. These Care Teams include your primary Cardiologist (physician) and Advanced Practice Providers (APPs- Physician Assistants and Nurse Practitioners) who all work together to provide you with the care you need, when you need it.   You may see any of the following providers on your designated Care Team at your next  follow up: Dr Arvilla Meres Dr [name] Dr. Dorthula Nettles Dr. Clearnce Hasten Amy Filbert Schilder, NP Robbie Lis, [place] [facility] B[place]mer, [place] Brynda Peon, NP [place] Lee, NP Clarisa Kindred, NP Karle Plumber, PharmD Enos Fling, PharmD   Please be sure to bring in all your medications bottles to every appointment.    Thank you for choosing [facility]

## 2023-09-27 NOTE — Progress Notes (Signed)
Height:     Weight: BMI:  Today's Date:  STOP BANG RISK ASSESSMENT S (snore) Have you been told that you snore?     YES   T (tired) Are you often tired, fatigued, or sleepy during the day?   YES  O (obstruction) Do you stop breathing, choke, or gasp during sleep? NO   P (pressure) Do you have or are you being treated for high blood pressure? YES   B (BMI) Is your body index greater than 35 kg/m? NO   A (age) Are you 48 years old or older? NO   N (neck) Do you have a neck circumference greater than 16 inches?   NO   G (gender) Are you a male? YES   TOTAL STOP/BANG "YES" ANSWERS 4                                                                       For Office Use Only              Procedure Order Form    YES to 3+ Stop Bang questions OR two clinical symptoms - patient qualifies for WatchPAT (CPT 95800)      Clinical Notes: Will consult Sleep Specialist and refer for management of therapy due to patient increased risk of Sleep Apnea. Ordering a sleep study due to the following two clinical symptoms: Excessive daytime sleepiness G47.10 / Gastroesophageal reflux K21.9 / Nocturia R35.1 / Morning Headaches G44.221 / Difficulty concentrating R41.840 / Memory problems or poor judgment G31.84 / Personality changes or irritability R45.4 / Loud snoring R06.83 / Depression F32.9 / Unrefreshed by sleep G47.8 / Impotence N52.9 / History of high blood pressure R03.0 / Insomnia G47.00

## 2023-09-28 NOTE — Progress Notes (Signed)
 Patient ID: Ethan York, male   DOB: January 14, 1976, 48 y.o.   MRN: [id number]   PCP: Lianne Moris, PA-C [facility]) Cardiology: Dr. Shirlee Latch  Chief complaint: CHF  48 y.o. with history of HTN and OSA was admitted to [facility] in 11/16 with several weeks of progressive dyspnea and was found to have acute systolic CHF with EF 20-25% by echo.  No chest pain.  He was diuresed and had left and right heart cath, showing preserved cardiac output with elevated filling pressures and no significant CAD.  Echo 3/17 with persistently decreased EF, 25%.  CPX 4/17 with moderate functional limitation due to heart failure.  He had St Jude CRT-D placed in 5/17.  Echo 10/18 showed EF 30-35% with diffuse hypokinesis.  Echo in 10/19 showed EF 50-55%, aortic root 4.4 cm. CTA chest in 1/20 showed no ascending aorta or root aneurysm. Echo in 11/20 with EF up to 55-60%.   Echo 3/23 EF 45% with mild LVH and normal RV.  Echo in 1/25 showed EF 45-50%, mild LVH, normal IVC, normal RV size/systolic function.   Today he returns for HF follow up. He works as a Production designer, theatre/television/film at [employer].  Has prominent bilateral lower leg venous varicosities, had significant bleeding from one of them recently.  Still smoking 1 ppd.  No lightheadedness or palpitations.  No exertional dyspnea or chest pain.  No orthopnea/PND.   St Jude device interrogation: >98% BiV pacing, stable thoracic impedance.    Labs (11/16): K 3.3, creatinine 1.19, LDL 84, BNP 884, TSH normal Labs (12/16): K 4.3, creatinine 1.06, BNP 123 Labs (07/2015): K 4.8 Creatinine 1.04  Labs (09/2015): Creatinine 1.26  Labs (5/17): K 4.1, creatinine 0.96 Labs (9/18): TSH normal, K 3.8, creatihine 1.03, hgb 15.4 Labs (9/19): K 4.4, creatinine 1.05 => 1.02 => 0.9, hgb 14.3, LDL 95 Labs (1/20): LDL 95 Labs (10/20): LDL 94, TGs 172, K 3.6, creatinine 1.08 Labs (10/22): K 3.9, creatinine 0.99, hgb 16.6 Labs (4/24): K 3.2, creatinine 1.13 Labs (12/24): K 3.6, creatinine 0.88, LDL 104  ECG  (personally reviewed): NSR with BiV pacing  PMH: 1. HTN 2. OSA: Using CPAP   3. GERD 4. Chronic systolic CHF: Nonischemic cardiomyopathy.   - Echo (11/16) with EF 20-25%, diffuse hypokinesis, mild-moderate MR, PASP 35 mmHg, severe LAE.  - LHC/RHC (11/16) with no significant CAD; mean RA 9, PA 50/29 mean 37, mean PCWP 22, CI 2.47, PVR 2.13, LVEDP 30.  - Unable to fit for cardiac MRI. - Echo (3/17) with EF 25%, septal-lateral dyssynchrony, mild to moderately dilated LV, normal RV size and systolic function.  - CPX (4/17): peak VO2 16.6 (54% predicted), VE/VCO2 slope 25, RER 1.13 => moderate HF limitation.  - 5/17 St Jude CRT-D placed.  - Echo (10/18): EF 30-35%, diffuse hypokinesis, normal RV size and systolic function.  - Echo (10/19): EF 50-55%, aortic root 4.4 cm.  - CPX (10/19): Peak VO2 16.5, VE/CO2 26, RER 1.15 => moderate functional impairment, likely due to weight/body habitus.   - Echo (11/20): EF 55-60%, normal RV.  - Echo (3/23): EF 45%, mild LVH, normal RV.  - Echo (1/25): EF 45-50%, mild LVH, normal IVC, normal RV size/systolic function.  5. LBBB 6. Active smoker 7. Dilated aortic root: 4.4 cm on 10/19 echo.  - CTA chest (1/20) did not show dilated root or ascending aorta.  8. Venous insufficiency.   FH: - Brother with MI at 68 - Mother with MI in her 5s - No FH of cardiomyopathy.  SH: Married, lives in [name], Production designer, theatre/television/film at [employer].  Active smoker.  Occasional ETOH.   ROS: All systems reviewed and negative except as per HPI.   Current Outpatient Medications  Medication Sig Dispense Refill   Aspirin-Acetaminophen 500-325 MG PACK Take 1 packet by mouth daily as needed (pain).      carvedilol (COREG) 25 MG tablet Take 1 tablet (25 mg total) by mouth 2 (two) times daily. 60 tablet 11   empagliflozin (JARDIANCE) 10 MG TABS tablet Take 1 tablet by mouth daily before breakfast. 90 tablet 3   furosemide (LASIX) 20 MG tablet Take 1 tablet (20 mg total) by mouth as needed. 30  tablet 6   rosuvastatin (CRESTOR) 10 MG tablet Take 1 tablet (10 mg total) by mouth daily. 90 tablet 3   sacubitril-valsartan (ENTRESTO) 97-103 MG Take 1 tablet by mouth 2 (two) times daily. Must keep 12/11 ov for additional refills 180 tablet 3   sildenafil (VIAGRA) 100 MG tablet Take 1 tablet (100 mg total) by mouth daily as needed for erectile dysfunction. 10 tablet 3   spironolactone (ALDACTONE) 50 MG tablet Take 1 tablet (50 mg total) by mouth daily. 90 tablet 3   No current facility-administered medications for this encounter.   Wt Readings from Last 3 Encounters:  09/27/23 (!) 151.5 kg (334 lb)  07/12/23 (!) 149.5 kg (329 lb 9.6 oz)  11/10/21 (!) 155.2 kg (342 lb 3.2 oz)   BP 130/80   Pulse 70   Wt (!) 151.5 kg (334 lb)   SpO2 95%   BMI 37.63 kg/m  General: NAD Neck: No JVD, no thyromegaly or thyroid nodule.  Lungs: Clear to auscultation bilaterally with normal respiratory effort. CV: Nondisplaced PMI.  Heart regular S1/S2, no S3/S4, no murmur.  No peripheral edema.  No carotid bruit.  Normal pedal pulses.  Abdomen: Soft, nontender, no hepatosplenomegaly, no distention.  Skin: Intact without lesions or rashes.  Neurologic: Alert and oriented x 3.  Psych: Normal affect. Extremities: No clubbing or cyanosis. Prominent venous varicosities.  HEENT: Normal.   Assessment/Plan: 1. Chronic systolic CHF => HF with mid range EF: Nonischemic cardiomyopathy.  EF 20-25% on 11/16 echo, repeat echo in 3/17 with EF 25%, septal-lateral dyssynchrony.  Unable to fit in MRI.  Cause of cardiomyopathy still uncertain.  No significant CAD on coronary angiography.  Has family history of CAD but not cardiomyopathy.  Possible viral myocarditis, also consider LBBB cardiomyopathy. He now has Secondary school teacher CRT-D device.  Echo 10/18 with EF 30-35%. Echo in 11/20 showed EF up to 55-60%.  Echo 3/23 showed EF down to 45%. Echo 1/25 showed EF 45-50%, mild LVH, normal IVC, normal RV size/systolic function. NYHA class  I, he is not volume overloaded on exam or by [place].  - Continue spironolactone 50 mg daily.  BMET/BNP today.  - Continue Entresto 97/103 bid.  - Continue Coreg 25 mg bid - Continue Jardiance 10 mg daily. - He has Lasix 20 mg to use prn.  2. Smoking: Still smoking.  He could not tolerate Chantix and failed Wellbutrin.  - I encouraged him to try nicotine patches.  3. OSA: He has daytime sleepiness, snoring.  - I will order home sleep study.  4. Obesity: Body mass index is 37.63 kg/m. - I will refer to pharmacy clinic to see if he can get GLP-1 agonist. 5.  HTN: BP controlled.   Followup 6 months with APP.   I spent 31 minutes reviewing records, interviewing/examining patient, and managing orders.  [name]  09/28/2023

## 2023-10-09 ENCOUNTER — Other Ambulatory Visit (HOSPITAL_COMMUNITY): Payer: Self-pay

## 2023-10-12 ENCOUNTER — Telehealth (HOSPITAL_COMMUNITY): Payer: Self-pay

## 2023-10-12 NOTE — Telephone Encounter (Signed)
 Detailed message per DPR Ok to use itamar   Pt aware and voiced understanding

## 2023-10-12 NOTE — Telephone Encounter (Signed)
 Attempted to reach patient about Itamar sleep study that he can proceed. Left message if any questions to call office. Number provided

## 2023-10-13 ENCOUNTER — Encounter (INDEPENDENT_AMBULATORY_CARE_PROVIDER_SITE_OTHER): Admitting: Cardiology

## 2023-10-13 DIAGNOSIS — G4733 Obstructive sleep apnea (adult) (pediatric): Secondary | ICD-10-CM

## 2023-10-16 ENCOUNTER — Ambulatory Visit: Attending: Cardiology

## 2023-10-16 ENCOUNTER — Telehealth: Payer: Self-pay

## 2023-10-16 DIAGNOSIS — G4733 Obstructive sleep apnea (adult) (pediatric): Secondary | ICD-10-CM

## 2023-10-16 DIAGNOSIS — I5022 Chronic systolic (congestive) heart failure: Secondary | ICD-10-CM

## 2023-10-16 DIAGNOSIS — I10 Essential (primary) hypertension: Secondary | ICD-10-CM

## 2023-10-16 NOTE — Procedures (Signed)
   SLEEP STUDY REPORT Patient Information Study Date: 10/13/2023 Patient Name: Ethan York Patient ID: [id number] Birth Date: 06-09-1976 Age: 48 Gender: Male BMI: 37.6 (W=335 lb, H=6' 7'') Stopbang: 4 Referring Physician: [name], MD  TEST DESCRIPTION: Home sleep apnea testing was completed using the WatchPat, a Type 1 device, utilizing  peripheral arterial tonometry (PAT), chest movement, actigraphy, pulse oximetry, pulse rate, body position and snore.  AHI was calculated with apnea and hypopnea using valid sleep time as the denominator. RDI includes apneas,  hypopneas, and RERAs. The data acquired and the scoring of sleep and all associated events were performed in  accordance with the recommended standards and specifications as outlined in the AASM Manual for the Scoring of  Sleep and Associated Events 2.2.0 (2015).  FINDINGS:  1. Moderate Obstructive Sleep Apnea with AHI 17.8/hr.   2. No Central Sleep Apnea with pAHIc 1.2/hr.  3. Oxygen desaturations as low as 86%.  4. Severe snoring was present. O2 sats were < 88% for 0.8 min.  5. Total sleep time was 6 hrs and 38 min.  6. 25.6% of total sleep time was spent in REM sleep.   7. Normal sleep onset latency at 16 min  8. Shortened REM sleep onset latency at 68 min.   9. Total awakenings were 12.  10. Arrhythmia detection: None  DIAGNOSIS:  Moderate Obstructive Sleep Apnea (G47.33)  RECOMMENDATIONS: 1. Clinical correlation of these findings is necessary. The decision to treat obstructive sleep apnea (OSA) is usually  based on the presence of apnea symptoms or the presence of associated medical conditions such as Hypertension,  Congestive Heart Failure, Atrial Fibrillation or Obesity. The most common symptoms of OSA are snoring, gasping for  breath while sleeping, daytime sleepiness and fatigue.   2. Initiating apnea therapy is recommended given the presence of symptoms and/or associated conditions.  Recommend  proceeding with one of the following:   a. Auto-CPAP therapy with a pressure range of 5-20cm H2O.   b. An oral appliance (OA) that can be obtained from certain dentists with expertise in sleep medicine. These are  primarily of use in non-obese patients with mild and moderate disease.   c. An ENT consultation which may be useful to look for specific causes of obstruction and possible treatment  options.   d. If patient is intolerant to PAP therapy, consider referral to ENT for evaluation for hypoglossal nerve stimulator.   3. Close follow-up is necessary to ensure success with CPAP or oral appliance therapy for maximum benefit .  4. A follow-up oximetry study on CPAP is recommended to assess the adequacy of therapy and determine the need  for supplemental oxygen or the potential need for Bi-level therapy. An arterial blood gas to determine the adequacy of  baseline ventilation and oxygenation should also be considered.  5. Healthy sleep recommendations include: adequate nightly sleep (normal 7-9 hrs/night), avoidance of caffeine after  noon and alcohol near bedtime, and maintaining a sleep environment that is cool, dark and quiet.  6. Weight loss for overweight patients is recommended. Even modest amounts of weight loss can significantly  improve the severity of sleep apnea.  7. Snoring recommendations include: weight loss where appropriate, side sleeping, and avoidance of alcohol before  bed.  8. Operation of motor vehicle should not be performed when sleepy.  Signature: Armanda Magic, MD; [facility]; Diplomat, American Board of Sleep  Medicine Electronically Signed: 10/16/2023 8:46:02 AM

## 2023-10-16 NOTE — Telephone Encounter (Signed)
 Called patient for results and recommendations. VM full,

## 2023-10-16 NOTE — Telephone Encounter (Signed)
-----   Message from Armanda Magic sent at 10/16/2023  8:47 AM EDT ----- Please let patient know that they have sleep apnea.  Recommend therapeutic CPAP titration for treatment of patient's sleep disordered breathing.

## 2023-10-26 NOTE — Telephone Encounter (Addendum)
 The patient has been notified of the result and verbalized understanding.  All questions (if any) were answered. Latrelle Dodrill, CMA 10/26/2023 4:31 PM    Will precert titration  Prior Authorization for TITRATION sent to [facility] via web portal. Tracking Number . [facility] # [id number]

## 2023-10-26 NOTE — Addendum Note (Signed)
 Addended by: Reesa Chew on: 10/26/2023 05:03 PM   Modules accepted: Orders

## 2023-10-30 ENCOUNTER — Ambulatory Visit: Payer: 59

## 2023-10-30 DIAGNOSIS — I428 Other cardiomyopathies: Secondary | ICD-10-CM

## 2023-10-30 DIAGNOSIS — I5022 Chronic systolic (congestive) heart failure: Secondary | ICD-10-CM

## 2023-11-01 LAB — CUP PACEART REMOTE DEVICE CHECK
Battery Remaining Longevity: 4 mo
Battery Remaining Percentage: 4 %
Battery Voltage: 2.62 V
Brady Statistic AP VP Percent: 11 %
Brady Statistic AP VS Percent: 1 %
Brady Statistic AS VP Percent: 87 %
Brady Statistic AS VS Percent: 1.1 %
Brady Statistic RA Percent Paced: 11 %
Date Time Interrogation Session: 20250331020015
HighPow Impedance: 86 Ohm
HighPow Impedance: 86 Ohm
Implantable Lead Connection Status: 753985
Implantable Lead Connection Status: 753985
Implantable Lead Connection Status: 753985
Implantable Lead Implant Date: 20170512
Implantable Lead Implant Date: 20170512
Implantable Lead Implant Date: 20170512
Implantable Lead Location: 753858
Implantable Lead Location: 753859
Implantable Lead Location: 753860
Implantable Lead Model: 7122
Implantable Pulse Generator Implant Date: 20170512
Lead Channel Impedance Value: 1075 Ohm
Lead Channel Impedance Value: 400 Ohm
Lead Channel Impedance Value: 590 Ohm
Lead Channel Pacing Threshold Amplitude: 0.75 V
Lead Channel Pacing Threshold Amplitude: 1 V
Lead Channel Pacing Threshold Amplitude: 1.25 V
Lead Channel Pacing Threshold Pulse Width: 0.5 ms
Lead Channel Pacing Threshold Pulse Width: 0.5 ms
Lead Channel Pacing Threshold Pulse Width: 1 ms
Lead Channel Sensing Intrinsic Amplitude: 12 mV
Lead Channel Sensing Intrinsic Amplitude: 5 mV
Lead Channel Setting Pacing Amplitude: 2 V
Lead Channel Setting Pacing Amplitude: 2.25 V
Lead Channel Setting Pacing Amplitude: 2.25 V
Lead Channel Setting Pacing Pulse Width: 0.5 ms
Lead Channel Setting Pacing Pulse Width: 1 ms
Lead Channel Setting Sensing Sensitivity: 0.5 mV
Pulse Gen Serial Number: 7355027

## 2023-11-01 NOTE — Progress Notes (Signed)
 Remote ICD transmission.

## 2023-11-08 ENCOUNTER — Other Ambulatory Visit (HOSPITAL_COMMUNITY): Payer: Self-pay

## 2023-11-18 ENCOUNTER — Encounter: Payer: Self-pay | Admitting: Internal Medicine

## 2023-11-21 ENCOUNTER — Ambulatory Visit: Payer: Self-pay

## 2023-11-29 NOTE — Telephone Encounter (Signed)
  Pt is calling to follow up CPAP machine

## 2023-11-30 LAB — CUP PACEART REMOTE DEVICE CHECK
Battery Remaining Longevity: 4 mo
Battery Remaining Percentage: 4 %
Battery Voltage: 2.62 V
Brady Statistic AP VP Percent: 11 %
Brady Statistic AP VS Percent: 1 %
Brady Statistic AS VP Percent: 87 %
Brady Statistic AS VS Percent: 1.1 %
Brady Statistic RA Percent Paced: 11 %
Date Time Interrogation Session: 20250501020017
HighPow Impedance: 93 Ohm
HighPow Impedance: 93 Ohm
Implantable Lead Connection Status: 753985
Implantable Lead Connection Status: 753985
Implantable Lead Connection Status: 753985
Implantable Lead Implant Date: 20170512
Implantable Lead Implant Date: 20170512
Implantable Lead Implant Date: 20170512
Implantable Lead Location: 753858
Implantable Lead Location: 753859
Implantable Lead Location: 753860
Implantable Lead Model: 7122
Implantable Pulse Generator Implant Date: 20170512
Lead Channel Impedance Value: 1125 Ohm
Lead Channel Impedance Value: 410 Ohm
Lead Channel Impedance Value: 550 Ohm
Lead Channel Pacing Threshold Amplitude: 0.75 V
Lead Channel Pacing Threshold Amplitude: 1 V
Lead Channel Pacing Threshold Amplitude: 1.125 V
Lead Channel Pacing Threshold Pulse Width: 0.5 ms
Lead Channel Pacing Threshold Pulse Width: 0.5 ms
Lead Channel Pacing Threshold Pulse Width: 1 ms
Lead Channel Sensing Intrinsic Amplitude: 12 mV
Lead Channel Sensing Intrinsic Amplitude: 5 mV
Lead Channel Setting Pacing Amplitude: 2 V
Lead Channel Setting Pacing Amplitude: 2.125
Lead Channel Setting Pacing Amplitude: 2.25 V
Lead Channel Setting Pacing Pulse Width: 0.5 ms
Lead Channel Setting Pacing Pulse Width: 1 ms
Lead Channel Setting Sensing Sensitivity: 0.5 mV
Pulse Gen Serial Number: 7355027

## 2023-12-04 ENCOUNTER — Other Ambulatory Visit (HOSPITAL_COMMUNITY): Payer: Self-pay

## 2023-12-04 ENCOUNTER — Other Ambulatory Visit: Payer: Self-pay

## 2023-12-04 ENCOUNTER — Ambulatory Visit: Payer: 59

## 2023-12-04 DIAGNOSIS — I428 Other cardiomyopathies: Secondary | ICD-10-CM

## 2023-12-04 DIAGNOSIS — I5022 Chronic systolic (congestive) heart failure: Secondary | ICD-10-CM

## 2023-12-05 ENCOUNTER — Telehealth: Payer: Self-pay | Admitting: Cardiology

## 2023-12-05 NOTE — Telephone Encounter (Signed)
 Woodro with [employer] called in requesting to speak with Aisha Hove, CMA. He says they received a request for sleep study with a different date of service and he wanted to know if it needed to be changed. Previous study was already approved for 5/01-8/20. Request was for 5/29-8/27.  Phone#: [phone] (ext#: [phone])

## 2023-12-06 ENCOUNTER — Ambulatory Visit: Attending: Cardiology | Admitting: Cardiology

## 2023-12-06 ENCOUNTER — Encounter: Payer: Self-pay | Admitting: Cardiology

## 2023-12-06 VITALS — BP 110/62 | HR 61 | Ht 79.0 in | Wt 324.5 lb

## 2023-12-06 DIAGNOSIS — Z95810 Presence of automatic (implantable) cardiac defibrillator: Secondary | ICD-10-CM

## 2023-12-06 DIAGNOSIS — I447 Left bundle-branch block, unspecified: Secondary | ICD-10-CM | POA: Diagnosis not present

## 2023-12-06 DIAGNOSIS — I5022 Chronic systolic (congestive) heart failure: Secondary | ICD-10-CM

## 2023-12-06 DIAGNOSIS — I428 Other cardiomyopathies: Secondary | ICD-10-CM | POA: Diagnosis not present

## 2023-12-06 NOTE — Progress Notes (Unsigned)
  Electrophysiology Office Note:   Date:  12/07/2023  ID:  [name], DOB [birth year], MRN [id number]  Primary Cardiologist: None Electrophysiologist: Ardeen Kohler, MD      History of Present Illness:   Ethan York is a 48 y.o. male with h/o HTN, OSA, chronic systolic heart failure secondary to non-ischemic cardiomyopathy and LBBB s/p CRT-D in 11/2015 who is being seen today for follow up of his device. He has an estimated 4 months until ERI. He is doing relatively well. He states that symptoms improved significantly after CRT was implanted years ago. Doing relatively well now. 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 He is active. No new or acute complaints today.  Review of systems complete and found to be negative unless listed in HPI.   EP Information / Studies Reviewed:    EKG is not ordered today. EKG from 09/27/23 reviewed which showed AS and BiV paced.      EKG 06/18/2015 (pre CRT): LBBB   Echo 10/07/21:  1. Left ventricular ejection fraction, by estimation, is 45%. The left  ventricle has mildly decreased function. The left ventricle demonstrates  global hypokinesis. There is mild left ventricular hypertrophy. Left  ventricular diastolic parameters are  consistent with Grade I diastolic dysfunction (impaired relaxation).   2. Right ventricular systolic function is normal. The right ventricular  size is normal. Tricuspid regurgitation signal is inadequate for assessing  PA pressure.   3. The mitral valve is normal in structure. No evidence of mitral valve  regurgitation. No evidence of mitral stenosis.   4. The aortic valve is tricuspid. Aortic valve regurgitation is not  visualized. No aortic stenosis is present.   5. Aortic dilatation noted. There is mild dilatation of the ascending  aorta, measuring 38 mm.   6. The inferior vena cava is dilated in size with >50% respiratory  variability, suggesting right atrial pressure of 8 mmHg.   Physical Exam:   VS:   BP 110/62   Pulse 61   Ht 6\' 7"  (2.007 m)   Wt (!) 324 lb 8 oz (147.2 kg)   SpO2 98%   BMI 36.56 kg/m    Wt Readings from Last 3 Encounters:  12/06/23 (!) 324 lb 8 oz (147.2 kg)  09/27/23 (!) 334 lb (151.5 kg)  07/12/23 (!) 329 lb 9.6 oz (149.5 kg)     GEN: Well nourished, well developed in no acute distress NECK: No JVD CARDIAC: Normal rate, regular rhythm, well healed left chest ICD pocket RESPIRATORY:  Clear to auscultation without rales, wheezing or rhonchi  ABDOMEN: Soft, non-distended EXTREMITIES:  Trace edema bilaterally; No deformity   ASSESSMENT AND PLAN:    #. S/p CRT-D: Placed for low EF and LBBB. Significant improvement in EF. Now with estimated 4 months to ERI.  - In clinic device interrogation was performed. Appropriate function and stable lead parameters.  - We will schedule generator change once ERI is reached. Explained risks, benefits, and alternatives to generator change, including but not limited to bleeding, infection, damage to heart or lungs.  Pt verbalized understanding and agrees to proceed when indicated.   #. Chronic systolic heart failure: Appears relatively well compensated.  #. NICM: LBBB - Continue GDMT regimen of carvedilol  25mg  BID, empagliflozin  10mg  daily, Entresto  97-103mg  BID, spironolactone  50mg  daily. - Follow up with our HF team, Dr. Mitzie Anda.   Follow up with Dr. Daneil Dunker for generator change.  Signed, Ardeen Kohler, MD

## 2023-12-13 ENCOUNTER — Encounter: Admitting: Internal Medicine

## 2023-12-15 NOTE — Progress Notes (Signed)
 Remote ICD transmission.

## 2023-12-15 NOTE — Addendum Note (Signed)
 Addended by: Edra Govern D on: 12/15/2023 02:14 PM   Modules accepted: Orders

## 2023-12-19 NOTE — Telephone Encounter (Signed)
 Per the sleep lab: Left a message to schedule the appt. 12/15/23 TP.  Reached out the patient and gave him the number to the sleep lab.

## 2024-01-01 ENCOUNTER — Ambulatory Visit (INDEPENDENT_AMBULATORY_CARE_PROVIDER_SITE_OTHER)

## 2024-01-01 DIAGNOSIS — I5022 Chronic systolic (congestive) heart failure: Secondary | ICD-10-CM

## 2024-01-01 LAB — CUP PACEART REMOTE DEVICE CHECK
Battery Remaining Longevity: 1 mo
Battery Remaining Percentage: 1 %
Battery Voltage: 2.6 V
Brady Statistic AP VP Percent: 11 %
Brady Statistic AP VS Percent: 1 %
Brady Statistic AS VP Percent: 87 %
Brady Statistic AS VS Percent: 1.1 %
Brady Statistic RA Percent Paced: 11 %
Date Time Interrogation Session: 20250601020017
HighPow Impedance: 91 Ohm
HighPow Impedance: 91 Ohm
Implantable Lead Connection Status: 753985
Implantable Lead Connection Status: 753985
Implantable Lead Connection Status: 753985
Implantable Lead Implant Date: 20170512
Implantable Lead Implant Date: 20170512
Implantable Lead Implant Date: 20170512
Implantable Lead Location: 753858
Implantable Lead Location: 753859
Implantable Lead Location: 753860
Implantable Lead Model: 7122
Implantable Pulse Generator Implant Date: 20170512
Lead Channel Impedance Value: 1200 Ohm
Lead Channel Impedance Value: 460 Ohm
Lead Channel Impedance Value: 660 Ohm
Lead Channel Pacing Threshold Amplitude: 1 V
Lead Channel Pacing Threshold Amplitude: 1 V
Lead Channel Pacing Threshold Amplitude: 1.125 V
Lead Channel Pacing Threshold Pulse Width: 0.5 ms
Lead Channel Pacing Threshold Pulse Width: 0.5 ms
Lead Channel Pacing Threshold Pulse Width: 1 ms
Lead Channel Sensing Intrinsic Amplitude: 12 mV
Lead Channel Sensing Intrinsic Amplitude: 5 mV
Lead Channel Setting Pacing Amplitude: 2 V
Lead Channel Setting Pacing Amplitude: 2 V
Lead Channel Setting Pacing Amplitude: 2.125
Lead Channel Setting Pacing Pulse Width: 0.5 ms
Lead Channel Setting Pacing Pulse Width: 1 ms
Lead Channel Setting Sensing Sensitivity: 0.5 mV
Pulse Gen Serial Number: 7355027

## 2024-01-08 ENCOUNTER — Telehealth: Payer: Self-pay

## 2024-01-08 ENCOUNTER — Ambulatory Visit: Payer: Self-pay | Admitting: Cardiology

## 2024-01-08 ENCOUNTER — Other Ambulatory Visit: Payer: Self-pay

## 2024-01-08 DIAGNOSIS — I5022 Chronic systolic (congestive) heart failure: Secondary | ICD-10-CM

## 2024-01-08 NOTE — Telephone Encounter (Signed)
 Pt ICD alarmed yesterday. It looks like he has reached ERI.

## 2024-01-08 NOTE — Telephone Encounter (Signed)
 Per Dr. Albertine Alpha lst note 5/7 would schedule generator change once reached ERI.  Will forward to April Garrison to schedule.

## 2024-01-08 NOTE — Telephone Encounter (Signed)
 Alert remote transmission:  ERI reached 6/8

## 2024-01-09 NOTE — Addendum Note (Signed)
 Addended by: Cosme Jacob on: 01/09/2024 03:03 PM   Modules accepted: Orders

## 2024-01-09 NOTE — Telephone Encounter (Signed)
 Patient called to follow-up on scheduling generator change visit.

## 2024-01-09 NOTE — Telephone Encounter (Signed)
 Pt scheduled on 8/14 at 12:30 for ICD Gen Change with Dr. Daneil Dunker.  Spoke with patient to complete pre-procedure call.     New medical conditions?  No Recent hospitalizations or surgeries? No Started any new medications? No Patient made aware to contact office to inform of any new medications started. Any changes in activities of daily living? No  Pre-procedure testing scheduled: lab work on 7/30 at [facility] in [place]  Confirmed patient is scheduled for  ICD generator change on Thursday, August 14 with Dr. Clinton Danas. Instructed patient to arrive at the Main Entrance A at [facility]: [address] [place], [place] [address] and check in at Admitting at 10:30 AM  Advised of plan to go home the same day and will only stay overnight if medically necessary. You MUST have a responsible adult to drive you home and MUST be with you the first 24 hours after you arrive home or your procedure could be cancelled.  Patient verbalized understanding to information provided and is agreeable to proceed with procedure.

## 2024-01-23 NOTE — Progress Notes (Signed)
 Remote ICD transmission.

## 2024-01-31 ENCOUNTER — Telehealth: Payer: Self-pay

## 2024-01-31 NOTE — Telephone Encounter (Signed)
 Spoke with the patient and he needs to check with work. He will give me a call or send me a MyChart message with an answer.

## 2024-01-31 NOTE — Telephone Encounter (Signed)
 Left message for patient to call back.  Was offering to move his ICD gen change up to 7/28 with Dr. Kennyth at [facility].

## 2024-02-01 ENCOUNTER — Ambulatory Visit

## 2024-02-01 DIAGNOSIS — I428 Other cardiomyopathies: Secondary | ICD-10-CM

## 2024-02-01 LAB — CUP PACEART REMOTE DEVICE CHECK
Battery Remaining Longevity: 0 mo
Battery Voltage: 2.59 V
Brady Statistic AP VP Percent: 11 %
Brady Statistic AP VS Percent: 1 %
Brady Statistic AS VP Percent: 87 %
Brady Statistic AS VS Percent: 1 %
Brady Statistic RA Percent Paced: 11 %
Date Time Interrogation Session: 20250703025259
HighPow Impedance: 75 Ohm
HighPow Impedance: 75 Ohm
Implantable Lead Connection Status: 753985
Implantable Lead Connection Status: 753985
Implantable Lead Connection Status: 753985
Implantable Lead Implant Date: 20170512
Implantable Lead Implant Date: 20170512
Implantable Lead Implant Date: 20170512
Implantable Lead Location: 753858
Implantable Lead Location: 753859
Implantable Lead Location: 753860
Implantable Lead Model: 7122
Implantable Pulse Generator Implant Date: 20170512
Lead Channel Impedance Value: 1125 Ohm
Lead Channel Impedance Value: 410 Ohm
Lead Channel Impedance Value: 540 Ohm
Lead Channel Pacing Threshold Amplitude: 1 V
Lead Channel Pacing Threshold Amplitude: 1.125 V
Lead Channel Pacing Threshold Amplitude: 1.25 V
Lead Channel Pacing Threshold Pulse Width: 0.5 ms
Lead Channel Pacing Threshold Pulse Width: 0.5 ms
Lead Channel Pacing Threshold Pulse Width: 1 ms
Lead Channel Sensing Intrinsic Amplitude: 12 mV
Lead Channel Sensing Intrinsic Amplitude: 5 mV
Lead Channel Setting Pacing Amplitude: 2 V
Lead Channel Setting Pacing Amplitude: 2.125
Lead Channel Setting Pacing Amplitude: 2.25 V
Lead Channel Setting Pacing Pulse Width: 0.5 ms
Lead Channel Setting Pacing Pulse Width: 1 ms
Lead Channel Setting Sensing Sensitivity: 0.5 mV
Pulse Gen Serial Number: 7355027

## 2024-02-03 ENCOUNTER — Ambulatory Visit: Payer: Self-pay | Admitting: Cardiology

## 2024-02-13 ENCOUNTER — Other Ambulatory Visit (HOSPITAL_COMMUNITY): Payer: Self-pay

## 2024-02-13 ENCOUNTER — Ambulatory Visit (HOSPITAL_BASED_OUTPATIENT_CLINIC_OR_DEPARTMENT_OTHER): Attending: Cardiology | Admitting: Cardiology

## 2024-02-13 DIAGNOSIS — G4733 Obstructive sleep apnea (adult) (pediatric): Secondary | ICD-10-CM | POA: Diagnosis present

## 2024-02-13 DIAGNOSIS — I10 Essential (primary) hypertension: Secondary | ICD-10-CM | POA: Diagnosis not present

## 2024-02-13 DIAGNOSIS — I5022 Chronic systolic (congestive) heart failure: Secondary | ICD-10-CM | POA: Diagnosis not present

## 2024-02-20 ENCOUNTER — Ambulatory Visit: Payer: Self-pay

## 2024-02-20 NOTE — Procedures (Signed)
 Indications for Polysomnography The patient is a 48 year old Male who is 6' 6 and weighs 330.0 lbs. His BMI equals 38.2.  A full night titration treatment study was performed.  No medications were reported taken during the night.No Data. Polysomnogram Data A full night polysomnogram recorded the standard physiologic parameters including EEG, EOG, EMG, EKG, nasal and oral airflow.  Respiratory parameters of chest and abdominal movements were recorded with Respiratory Inductance Plethysmography belts.   Oxygen saturation was recorded by pulse oximetry.  Sleep Architecture The total recording time of the polysomnogram was 386.0 minutes.  The total sleep time was 344.5 minutes.  The patient spent 4.1% of total sleep time in Stage N1, 62.3% in Stage N2, 10.0% in Stages N3, and 23.7% in REM.  Sleep latency was 17.2 minutes.   REM latency was 143.5 minutes.  Sleep Efficiency was 89.3%.  Wake after Sleep Onset time was 24.0 minutes.  Titration Summary The patient was titrated at pressures ranging from 8 cm/H20 up to 18 cm/H20. The last pressure used in the study was 18 cm/H20.  Respiratory Events The polysomnogram revealed a presence of 1 obstructive, 1 central, and 0 mixed apneas resulting in an Apnea index of 0.3 events per hour.  There were 49 hypopneas (GreaterEqual to3% desaturation and/or arousal) resulting in an Apnea\Hypopnea Index (AHI  GreaterEqual to3% desaturation and/or arousal) of 8.9 events per hour.  There were 18 hypopneas (GreaterEqual to4% desaturation) resulting in an Apnea\Hypopnea Index (AHI GreaterEqual to4% desaturation) of 3.5 events per hour.  There were 9 Respiratory  Effort Related Arousals resulting in a RERA index of 1.6 events per hour. The Respiratory Disturbance Index is 10.4 events per hour.  The snore index was 0 events per hour.  Mean oxygen saturation was 92.9%.  The lowest oxygen saturation during sleep was 88.0%.  Time spent LessEqual to88% oxygen saturation was   minutes ().  Limb Activity There were 25 limb movements recorded.  Of this total, 22 were classified as PLMs.  Of the PLMs, 1 were associated with arousals.  The Limb Movement index was 4.4 per hour while the PLM index was 3.8 per hour.  Cardiac Summary The average pulse rate was 50.5 bpm.  The minimum pulse rate was 50.0 bpm while the maximum pulse rate was 75.0 bpm.  Cardiac rhythm was ventricular paced rhythm with occasional PVCs.  Diagnosis: Obstructive sleep apnea Successful CPAP titration  Recommendations: 1. Recommend a trial of ResMed CPAP at 18 cm H2O with EPR 2, heated humidity and medium Respironics Nuance Pro gel nasal pillow mask with chinstrap. 2. Close follow-up is necessary to ensure success with CPAP therapy for maximum benefit. 3. A follow-up oximetry study on CPAP is recommended to assess the adequacy of therapy and determine the need for supplemental oxygen or the potential need for Bi-level therapy.  An arterial blood gas to determine the adequacy of baseline ventilation and  oxygenation should also be considered. 4. Healthy sleep recommendations include:  adequate nightly sleep (normal 7-9 hrs/night), avoidance of caffeine after noon and alcohol near bedtime, and maintaining a sleep environment that is cool, dark and quiet. 5. Weight loss for overweight patients is recommended.  Even modest amounts of weight loss can significantly improve the severity of sleep apnea. 6. Snoring recommendations include:  weight loss where appropriate, side sleeping, and avoidance of alcohol before bed. 7. Operation of motor vehicle should be avoided when sleepy.   This study was personally reviewed and electronically signed by: Dr. Wilbert Bihari Accredited Board  Certified in Sleep Medicine Date/Time: 02/20/2024 11:09AM

## 2024-02-20 NOTE — Progress Notes (Signed)
 Remote ICD transmission.

## 2024-02-29 ENCOUNTER — Ambulatory Visit: Payer: Self-pay

## 2024-02-29 LAB — CBC
Hematocrit: 46.2 % (ref 37.5–51.0)
Hemoglobin: 15.3 g/dL (ref 13.0–17.7)
MCH: 31.5 pg (ref 26.6–33.0)
MCHC: 33.1 g/dL (ref 31.5–35.7)
MCV: 95 fL (ref 79–97)
Platelets: 241 x10E3/uL (ref 150–450)
RBC: 4.86 x10E6/uL (ref 4.14–5.80)
RDW: 12.3 % (ref 11.6–15.4)
WBC: 9 x10E3/uL (ref 3.4–10.8)

## 2024-02-29 LAB — BASIC METABOLIC PANEL WITH GFR
BUN/Creatinine Ratio: 14 (ref 9–20)
BUN: 12 mg/dL (ref 6–24)
CO2: 22 mmol/L (ref 20–29)
Calcium: 9.3 mg/dL (ref 8.7–10.2)
Chloride: 101 mmol/L (ref 96–106)
Creatinine, Ser: 0.83 mg/dL (ref 0.76–1.27)
Glucose: 103 mg/dL — AB (ref 70–99)
Potassium: 3.9 mmol/L (ref 3.5–5.2)
Sodium: 139 mmol/L (ref 134–144)
eGFR: 108 mL/min/1.73 (ref >59)

## 2024-03-04 ENCOUNTER — Ambulatory Visit

## 2024-03-04 DIAGNOSIS — I5022 Chronic systolic (congestive) heart failure: Secondary | ICD-10-CM

## 2024-03-04 LAB — CUP PACEART REMOTE DEVICE CHECK
Battery Remaining Longevity: 0 mo
Battery Voltage: 2.59 V
Brady Statistic AP VP Percent: 11 %
Brady Statistic AP VS Percent: 1 %
Brady Statistic AS VP Percent: 87 %
Brady Statistic AS VS Percent: 1 %
Brady Statistic RA Percent Paced: 11 %
Date Time Interrogation Session: 20250803020015
HighPow Impedance: 98 Ohm
HighPow Impedance: 98 Ohm
Implantable Lead Connection Status: 753985
Implantable Lead Connection Status: 753985
Implantable Lead Connection Status: 753985
Implantable Lead Implant Date: 20170512
Implantable Lead Implant Date: 20170512
Implantable Lead Implant Date: 20170512
Implantable Lead Location: 753858
Implantable Lead Location: 753859
Implantable Lead Location: 753860
Implantable Lead Model: 7122
Implantable Pulse Generator Implant Date: 20170512
Lead Channel Impedance Value: 1175 Ohm
Lead Channel Impedance Value: 440 Ohm
Lead Channel Impedance Value: 640 Ohm
Lead Channel Pacing Threshold Amplitude: 1 V
Lead Channel Pacing Threshold Amplitude: 1 V
Lead Channel Pacing Threshold Amplitude: 1.25 V
Lead Channel Pacing Threshold Pulse Width: 0.5 ms
Lead Channel Pacing Threshold Pulse Width: 0.5 ms
Lead Channel Pacing Threshold Pulse Width: 1 ms
Lead Channel Sensing Intrinsic Amplitude: 12 mV
Lead Channel Sensing Intrinsic Amplitude: 5 mV
Lead Channel Setting Pacing Amplitude: 2 V
Lead Channel Setting Pacing Amplitude: 2 V
Lead Channel Setting Pacing Amplitude: 2.25 V
Lead Channel Setting Pacing Pulse Width: 0.5 ms
Lead Channel Setting Pacing Pulse Width: 1 ms
Lead Channel Setting Sensing Sensitivity: 0.5 mV
Pulse Gen Serial Number: 7355027

## 2024-03-07 ENCOUNTER — Telehealth (HOSPITAL_COMMUNITY): Payer: Self-pay

## 2024-03-07 NOTE — Telephone Encounter (Signed)
 Spoke with patient to discuss upcoming procedure.   Confirmed patient is scheduled for a ICD generator change on Thursday, August 14 with Dr. Sidra Kitty. Instructed patient to arrive at the Main Entrance A at [facility]: [address] [place], [place] [address] and check in at Admitting at 12:30 PM.   Labs completed  Any recent signs of acute illness or been started on antibiotics?  No Any new medications started? No Any medications to hold? Hold Jardiance  for 3 days prior to the procedure- last dose on August 10. Hold Furosemide  on the morning of your procedure. Medication instructions:  On the morning of your procedure you may take all other medications not discussed with a small sip of water. No eating or drinking after midnight prior to procedure.   The night before your procedure and the morning of your procedure, wash thoroughly with the CHG surgical soap or antibacterial soap from the neck down, paying special attention to the area where your procedure will be performed.  Advised of plan to go home the same day and will only stay overnight if medically necessary. You MUST have a responsible adult to drive you home and MUST be with you the first 24 hours after you arrive home.  Patient verbalized understanding to all instructions provided and agreed to proceed with procedure.

## 2024-03-10 ENCOUNTER — Ambulatory Visit: Payer: Self-pay | Admitting: Cardiology

## 2024-03-12 ENCOUNTER — Other Ambulatory Visit (HOSPITAL_COMMUNITY): Payer: Self-pay

## 2024-03-13 ENCOUNTER — Telehealth: Payer: Self-pay | Admitting: Cardiology

## 2024-03-13 NOTE — Telephone Encounter (Signed)
 Pt requesting c/b in regards to Sleep results. Please advise

## 2024-03-14 ENCOUNTER — Ambulatory Visit (HOSPITAL_COMMUNITY)
Admission: RE | Disposition: A | Discharge status: Home / Self Care | Payer: Self-pay | Source: Home / Self Care | Attending: Cardiology

## 2024-03-14 ENCOUNTER — Other Ambulatory Visit: Payer: Self-pay

## 2024-03-14 ENCOUNTER — Ambulatory Visit (HOSPITAL_COMMUNITY)
Admission: RE | Admit: 2024-03-14 | Discharge: 2024-03-14 | Disposition: A | Discharge status: Home / Self Care | Attending: Cardiology | Admitting: Cardiology

## 2024-03-14 DIAGNOSIS — G4733 Obstructive sleep apnea (adult) (pediatric): Secondary | ICD-10-CM | POA: Diagnosis not present

## 2024-03-14 DIAGNOSIS — Z4502 Encounter for adjustment and management of automatic implantable cardiac defibrillator: Secondary | ICD-10-CM

## 2024-03-14 DIAGNOSIS — I5022 Chronic systolic (congestive) heart failure: Secondary | ICD-10-CM | POA: Diagnosis not present

## 2024-03-14 DIAGNOSIS — Z79899 Other long term (current) drug therapy: Secondary | ICD-10-CM | POA: Insufficient documentation

## 2024-03-14 DIAGNOSIS — I428 Other cardiomyopathies: Secondary | ICD-10-CM | POA: Diagnosis not present

## 2024-03-14 DIAGNOSIS — I447 Left bundle-branch block, unspecified: Secondary | ICD-10-CM | POA: Diagnosis not present

## 2024-03-14 DIAGNOSIS — I110 Hypertensive heart disease with heart failure: Secondary | ICD-10-CM | POA: Insufficient documentation

## 2024-03-14 HISTORY — PX: ICD GENERATOR CHANGEOUT: EP1231

## 2024-03-14 SURGERY — ICD GENERATOR CHANGEOUT

## 2024-03-14 MED ORDER — FENTANYL CITRATE (PF) 100 MCG/2ML IJ SOLN
INTRAMUSCULAR | Status: AC
  Filled 2024-03-14: qty 2

## 2024-03-14 MED ORDER — LIDOCAINE HCL (PF) 1 % IJ SOLN
INTRAMUSCULAR | Status: AC
Start: 2024-03-14 — End: 2024-03-14
  Filled 2024-03-14: qty 60

## 2024-03-14 MED ORDER — ONDANSETRON HCL 4 MG/2ML IJ SOLN: 4.0000 mg | Freq: Four times a day (QID) | INTRAMUSCULAR | Status: DC | PRN

## 2024-03-14 MED ORDER — CEFAZOLIN SODIUM-DEXTROSE 2-4 GM/100ML-% IV SOLN
INTRAVENOUS | Status: AC
  Filled 2024-03-14: qty 100

## 2024-03-14 MED ORDER — CEFAZOLIN SODIUM-DEXTROSE 3-4 GM/150ML-% IV SOLN
3.0000 g | INTRAVENOUS | Status: AC
  Administered 2024-03-14: 3 g via INTRAVENOUS
  Filled 2024-03-14: qty 150

## 2024-03-14 MED ORDER — LIDOCAINE HCL (PF) 1 % IJ SOLN
INTRAMUSCULAR | Status: DC | PRN
  Administered 2024-03-14: 60 mL

## 2024-03-14 MED ORDER — MIDAZOLAM HCL 2 MG/2ML IJ SOLN
INTRAMUSCULAR | Status: AC
  Filled 2024-03-14: qty 2

## 2024-03-14 MED ORDER — MIDAZOLAM HCL 5 MG/5ML IJ SOLN
INTRAMUSCULAR | Status: DC | PRN
  Administered 2024-03-14 (×2): .5 mg via INTRAVENOUS
  Administered 2024-03-14: 1 mg via INTRAVENOUS
  Administered 2024-03-14: .5 mg via INTRAVENOUS

## 2024-03-14 MED ORDER — POVIDONE-IODINE 10 % EX SWAB: 2.0000 | Freq: Once | CUTANEOUS | Status: DC

## 2024-03-14 MED ORDER — CHLORHEXIDINE GLUCONATE 4 % EX SOLN: 4.0000 | Freq: Once | CUTANEOUS | Status: DC

## 2024-03-14 MED ORDER — ACETAMINOPHEN 325 MG PO TABS: 325.0000 mg | ORAL_TABLET | ORAL | Status: DC | PRN

## 2024-03-14 MED ORDER — FENTANYL CITRATE (PF) 100 MCG/2ML IJ SOLN
INTRAMUSCULAR | Status: DC | PRN
  Administered 2024-03-14: 25 ug via INTRAVENOUS
  Administered 2024-03-14: 50 ug via INTRAVENOUS
  Administered 2024-03-14 (×2): 25 ug via INTRAVENOUS

## 2024-03-14 MED ORDER — SODIUM CHLORIDE 0.9 % IV SOLN: 80.0000 mg | INTRAVENOUS | Status: AC

## 2024-03-14 MED ORDER — SODIUM CHLORIDE 0.9 % IV SOLN
INTRAVENOUS | Status: AC
  Administered 2024-03-14: 80 mg
  Filled 2024-03-14: qty 2

## 2024-03-14 MED ORDER — SODIUM CHLORIDE 0.9 % IV SOLN: INTRAVENOUS | Status: DC

## 2024-03-14 SURGICAL SUPPLY — 6 items
CABLE SURGICAL S-101-97-12 (CABLE) ×1 IMPLANT
DEVICE CRT GALLANT HF DF1 (ICD Generator) IMPLANT
PAD DEFIB RADIO PHYSIO CONN (PAD) ×1 IMPLANT
POUCH AIGIS-R ANTIBACT ICD LRG (Mesh General) IMPLANT
SHEATH PROBE COVER 6X72 (BAG) IMPLANT
TRAY PACEMAKER INSERTION (PACKS) ×1 IMPLANT

## 2024-03-14 NOTE — Discharge Instructions (Signed)

## 2024-03-14 NOTE — H&P (Signed)
   Electrophysiology Note:   Date:  03/14/24  ID:  Ethan York, DOB [birth year], MRN [id number]   Primary Cardiologist: None Electrophysiologist: Fonda Kitty, MD       History of Present Illness:   Ethan York is a 48 y.o. male with h/o HTN, OSA, chronic systolic heart failure secondary to non-ischemic cardiomyopathy and LBBB s/p CRT-D in 11/2015 who is being seen today for follow up of his device. He has an estimated 4 months until ERI. He is doing relatively well. He states that symptoms improved significantly after CRT was implanted years ago. Doing relatively well now. 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 He is active. No new or acute complaints today.  Interval: Patient presents today for scheduled generator change. Reports feeling relatively well. Some chronic LE swelling, R > L.    Review of systems complete and found to be negative unless listed in HPI.    EP Information / Studies Reviewed:     EKG is not ordered today. EKG from 09/27/23 reviewed which showed AS and BiV paced.       EKG 06/18/2015 (pre CRT): LBBB    Echo 10/07/21:  1. Left ventricular ejection fraction, by estimation, is 45%. The left  ventricle has mildly decreased function. The left ventricle demonstrates  global hypokinesis. There is mild left ventricular hypertrophy. Left  ventricular diastolic parameters are  consistent with Grade I diastolic dysfunction (impaired relaxation).   2. Right ventricular systolic function is normal. The right ventricular  size is normal. Tricuspid regurgitation signal is inadequate for assessing  PA pressure.   3. The mitral valve is normal in structure. No evidence of mitral valve  regurgitation. No evidence of mitral stenosis.   4. The aortic valve is tricuspid. Aortic valve regurgitation is not  visualized. No aortic stenosis is present.   5. Aortic dilatation noted. There is mild dilatation of the ascending  aorta, measuring 38 mm.   6. The inferior  vena cava is dilated in size with >50% respiratory  variability, suggesting right atrial pressure of 8 mmHg.    Physical Exam:    Today's Vitals   03/14/24 1246  BP: (!) 140/82  Pulse: 62  Resp: 18  Temp: 98.4 F (36.9 C)  TempSrc: Oral  SpO2: 98%  Weight: (!) 149.7 kg  Height: 6' 7 (2.007 m)   Body mass index is 37.18 kg/m.  GEN: Well nourished, well developed in no acute distress NECK: No JVD CARDIAC: Normal rate, regular rhythm, well healed left chest ICD pocket RESPIRATORY:  Clear to auscultation without rales, wheezing or rhonchi  ABDOMEN: Soft, non-distended EXTREMITIES:  Trace edema bilaterally; No deformity    ASSESSMENT AND PLAN:     #. S/p CRT-D: Placed for low EF and LBBB. Significant improvement in EF. Now at [facility]. - Explained risks, benefits, and alternatives to generator change, including but not limited to bleeding, infection, damage to heart or lungs.  Pt verbalized understanding and agrees to proceed today.   #. Chronic systolic heart failure: Appears relatively well compensated.  #. NICM: LBBB - Continue GDMT regimen of carvedilol  25mg  BID, empagliflozin  10mg  daily, Entresto  97-103mg  BID, spironolactone  50mg  daily. - Follow up with our HF team, Dr. Rolan.    Follow up with Dr. Kitty in 3 months.   Signed, Fonda Kitty, MD

## 2024-03-15 ENCOUNTER — Encounter: Payer: Self-pay | Admitting: Cardiology

## 2024-03-15 ENCOUNTER — Encounter (HOSPITAL_COMMUNITY): Payer: Self-pay | Admitting: Cardiology

## 2024-03-15 MED FILL — Midazolam HCl Inj 2 MG/2ML (Base Equivalent): INTRAMUSCULAR | Qty: 2.5 | Status: AC

## 2024-03-15 NOTE — Telephone Encounter (Signed)
 Follow-up after same day discharge: Implant date: 03/14/24 MD: Dr. Fonda Kitty Device: St. Jude Medical [id number] Nat HF Location: Left Chest   Wound check visit: 04/04/24 90 day MD follow-up: 06/14/2024  Remote Transmission received:Yes  Dressing/sling removed: Yes  Confirm OAC restart on: N/A  Please continue to monitor your cardiac device site for redness, swelling, and drainage. Call the device clinic at [phone] if you experience these symptoms, fever/chills, or have questions about your device.   Remote monitoring is used to monitor your cardiac device from home. This monitoring is scheduled every 91 days by our office. It allows us  to keep an eye on the functioning of your device to ensure it is working properly.   Discussed with patient to expect soreness at site for sometime. Encouraged patient to use tylenol  as needed and apply ice 10-15 minutes 3 times a day with barrier between skin and ice. Patient voiced understanding and appreciative of call.

## 2024-03-18 ENCOUNTER — Telehealth: Payer: Self-pay | Admitting: *Deleted

## 2024-03-18 NOTE — Telephone Encounter (Signed)
 Spoke w/ patient - he states his reasoning for needing to reschedule was d/t working day and night shift on 8/28. The time frame available for the 27th doesn't work with his morning appt at the HF Clinic. He inquired if possible to push it to the next Wednesday. Advised him that pushing it out a week would be too far, as we do our best to keep it within the 10-14 day time frame. Patient is going to try and make his 8/28 appt work with his work schedule.

## 2024-03-18 NOTE — Telephone Encounter (Signed)
 We do not have device clinic on 8/27.    If patient absolutely cannot make arrangements to be here on 8/28, then we can add him on between 2pm and 4pm that day. (8/27).   Please assess his ability to keep the 8/28 appt if at all possible.   Forwarding to EP Scheduling team to reach out to patient and make changes.

## 2024-03-18 NOTE — Telephone Encounter (Signed)
-----   Message from Fonda Kitty sent at 03/17/2024  9:05 PM EDT ----- Regarding: Wound check Patient had gen change last week. Informed me that he needs to change his wound check appt from Aug 28th to Aug 27th. Can we accommodate and update the patient?  Thanks, [employer]

## 2024-03-21 ENCOUNTER — Telehealth: Payer: Self-pay | Admitting: *Deleted

## 2024-03-21 DIAGNOSIS — G4733 Obstructive sleep apnea (adult) (pediatric): Secondary | ICD-10-CM

## 2024-03-21 DIAGNOSIS — I10 Essential (primary) hypertension: Secondary | ICD-10-CM

## 2024-03-21 DIAGNOSIS — I5022 Chronic systolic (congestive) heart failure: Secondary | ICD-10-CM

## 2024-03-21 NOTE — Telephone Encounter (Signed)
-----   Message from Wilbert Bihari sent at 02/20/2024 11:10 AM EDT ----- Please let patient know that they had a successful PAP titration and let DME know that orders are in EPIC.  Please set up 6 week OV with me.

## 2024-03-21 NOTE — Telephone Encounter (Signed)
 The patient has been notified of the result and verbalized understanding.  All questions (if any) were answered. Ethan York, CMA 03/21/2024 4:36 PM    Upon patient request DME selection is [facility] Patient understands he will be contacted by [facility] to set up his cpap. Patient understands to call if [facility] does not contact him with new setup in a timely manner. Patient understands they will be called once confirmation has been received from [facility] that they have received their new machine to schedule 10 week follow up appointment.   [facility] notified of new cpap order  Please add to airview Patient was grateful for the call and thanked me.

## 2024-03-26 ENCOUNTER — Telehealth (HOSPITAL_COMMUNITY): Payer: Self-pay

## 2024-03-26 NOTE — Progress Notes (Signed)
 Patient ID: Ethan York, male   DOB: Dec 27, 1975, 48 y.o.   MRN: [id number]   PCP: Dow Longs, PA-C [facility]) Cardiology: Dr. Rolan  Chief complaint: CHF  48 y.o. with history of HTN and OSA was admitted to [facility] in 11/16 with several weeks of progressive dyspnea and was found to have acute systolic CHF with EF 20-25% by echo.  No chest pain.  He was diuresed and had left and right heart cath, showing preserved cardiac output with elevated filling pressures and no significant CAD.  Echo 3/17 with persistently decreased EF, 25%.  CPX 4/17 with moderate functional limitation due to heart failure.  He had St Jude CRT-D placed in 5/17.  Echo 10/18 showed EF 30-35% with diffuse hypokinesis.  Echo in 10/19 showed EF 50-55%, aortic root 4.4 cm. CTA chest in 1/20 showed no ascending aorta or root aneurysm. Echo in 11/20 with EF up to 55-60%.   Echo 3/23 EF 45% with mild LVH and normal RV.  Echo in 1/25 showed EF 45-50%, mild LVH, normal IVC, normal RV size/systolic function.   Today he returns for HF follow up. He works as a Production designer, theatre/television/film at [employer].  Has prominent bilateral lower leg venous varicosities, had significant bleeding from one of them recently.  Still smoking 1 ppd.  No lightheadedness or palpitations.  No exertional dyspnea or chest pain.  No orthopnea/PND.   St Jude device interrogation: >98% BiV pacing, stable thoracic impedance.    Labs (11/16): K 3.3, creatinine 1.19, LDL 84, BNP 884, TSH normal Labs (12/16): K 4.3, creatinine 1.06, BNP 123 Labs (07/2015): K 4.8 Creatinine 1.04  Labs (09/2015): Creatinine 1.26  Labs (5/17): K 4.1, creatinine 0.96 Labs (9/18): TSH normal, K 3.8, creatihine 1.03, hgb 15.4 Labs (9/19): K 4.4, creatinine 1.05 => 1.02 => 0.9, hgb 14.3, LDL 95 Labs (1/20): LDL 95 Labs (10/20): LDL 94, TGs 172, K 3.6, creatinine 1.08 Labs (10/22): K 3.9, creatinine 0.99, hgb 16.6 Labs (4/24): K 3.2, creatinine 1.13 Labs (12/24): K 3.6, creatinine 0.88, LDL 104  ECG  (personally reviewed): NSR with BiV pacing  PMH: 1. HTN 2. OSA: Using CPAP   3. GERD 4. Chronic systolic CHF: Nonischemic cardiomyopathy.   - Echo (11/16) with EF 20-25%, diffuse hypokinesis, mild-moderate MR, PASP 35 mmHg, severe LAE.  - LHC/RHC (11/16) with no significant CAD; mean RA 9, PA 50/29 mean 37, mean PCWP 22, CI 2.47, PVR 2.13, LVEDP 30.  - Unable to fit for cardiac MRI. - Echo (3/17) with EF 25%, septal-lateral dyssynchrony, mild to moderately dilated LV, normal RV size and systolic function.  - CPX (4/17): peak VO2 16.6 (54% predicted), VE/VCO2 slope 25, RER 1.13 => moderate HF limitation.  - 5/17 St Jude CRT-D placed.  - Echo (10/18): EF 30-35%, diffuse hypokinesis, normal RV size and systolic function.  - Echo (10/19): EF 50-55%, aortic root 4.4 cm.  - CPX (10/19): Peak VO2 16.5, VE/CO2 26, RER 1.15 => moderate functional impairment, likely due to weight/body habitus.   - Echo (11/20): EF 55-60%, normal RV.  - Echo (3/23): EF 45%, mild LVH, normal RV.  - Echo (1/25): EF 45-50%, mild LVH, normal IVC, normal RV size/systolic function.  5. LBBB 6. Active smoker 7. Dilated aortic root: 4.4 cm on 10/19 echo.  - CTA chest (1/20) did not show dilated root or ascending aorta.  8. Venous insufficiency.   FH: - Brother with MI at 48 - Mother with MI in her 48s - No FH of cardiomyopathy.  SH: Married, lives in [place], Production designer, theatre/television/film at [employer].  Active smoker.  Occasional ETOH.   ROS: All systems reviewed and negative except as per HPI.   Current Outpatient Medications  Medication Sig Dispense Refill   Aspirin -Acetaminophen  500-325 MG PACK Take 1 packet by mouth 3 (three) times daily as needed (pain). [employer]     carvedilol  (COREG ) 25 MG tablet Take 1 tablet (25 mg total) by mouth 2 (two) times daily. 60 tablet 11   empagliflozin  (JARDIANCE ) 10 MG TABS tablet Take 1 tablet by mouth daily before breakfast. 90 tablet 3   furosemide  (LASIX ) 20 MG tablet Take 1 tablet (20 mg  total) by mouth as needed. 30 tablet 6   MOUNJARO 15 MG/0.5ML Pen Inject 15 mg into the skin once a week.     ondansetron  (ZOFRAN -ODT) 8 MG disintegrating tablet Take 8 mg by mouth 3 (three) times daily.     rosuvastatin  (CRESTOR ) 10 MG tablet Take 1 tablet (10 mg total) by mouth daily. 90 tablet 3   sacubitril -valsartan  (ENTRESTO ) 97-103 MG Take 1 tablet by mouth 2 (two) times daily. Must keep 12/11 ov for additional refills 180 tablet 3   sildenafil  (VIAGRA ) 100 MG tablet Take 1 tablet (100 mg total) by mouth daily as needed for erectile dysfunction. 10 tablet 3   spironolactone  (ALDACTONE ) 50 MG tablet Take 1 tablet (50 mg total) by mouth daily. 90 tablet 3   No current facility-administered medications for this visit.   Wt Readings from Last 3 Encounters:  03/14/24 (!) 149.7 kg (330 lb)  02/13/24 (!) 149.7 kg (330 lb)  12/06/23 (!) 147.2 kg (324 lb 8 oz)   There were no vitals taken for this visit. General: NAD Neck: No JVD, no thyromegaly or thyroid nodule.  Lungs: Clear to auscultation bilaterally with normal respiratory effort. CV: Nondisplaced PMI.  Heart regular S1/S2, no S3/S4, no murmur.  No peripheral edema.  No carotid bruit.  Normal pedal pulses.  Abdomen: Soft, nontender, no hepatosplenomegaly, no distention.  Skin: Intact without lesions or rashes.  Neurologic: Alert and oriented x 3.  Psych: Normal affect. Extremities: No clubbing or cyanosis. Prominent venous varicosities.  HEENT: Normal.   Assessment/Plan: 1. Chronic systolic CHF => HF with mid range EF: Nonischemic cardiomyopathy.  EF 20-25% on 11/16 echo, repeat echo in 3/17 with EF 25%, septal-lateral dyssynchrony.  Unable to fit in MRI.  Cause of cardiomyopathy still uncertain.  No significant CAD on coronary angiography.  Has family history of CAD but not cardiomyopathy.  Possible viral myocarditis, also consider LBBB cardiomyopathy. He now has [employer] school [employer] CRT-D device.  Echo 10/18 with EF 30-35%. Echo in 11/20 showed  EF up to 55-60%.  Echo 3/23 showed EF down to 45%. Echo 1/25 showed EF 45-50%, mild LVH, normal IVC, normal RV size/systolic function. NYHA class I, he is not volume overloaded on exam or by [place].  - Continue spironolactone  50 mg daily.  BMET/BNP today.  - Continue Entresto  97/103 bid.  - Continue Coreg  25 mg bid - Continue Jardiance  10 mg daily. - He has Lasix  20 mg to use prn.  2. Smoking: Still smoking.  He could not tolerate Chantix  and failed Wellbutrin .  - I encouraged him to try nicotine  patches.  3. OSA: He has daytime sleepiness, snoring.  - I will order home sleep study.  4. Obesity: There is no height or weight on file to calculate BMI. - I will refer to pharmacy clinic to see if he can get GLP-1  agonist. 5.  HTN: BP controlled.   Followup 6 months with APP.   I spent 31 minutes reviewing records, interviewing/examining patient, and managing orders.   Harlene HERO [facility]  03/26/2024

## 2024-03-26 NOTE — Telephone Encounter (Signed)
 Called to confirm/remind patient of their appointment at the [facility] on 03/27/24.   Appointment:   [x] Confirmed  [] Left mess   [] No answer/No voice mail  [] VM Full/unable to leave message  [] Phone not in service  Patient reminded to bring all medications and/or complete list.  Confirmed patient has transportation. Gave directions, instructed to utilize valet parking.

## 2024-03-27 ENCOUNTER — Encounter (HOSPITAL_COMMUNITY): Payer: Self-pay

## 2024-03-27 ENCOUNTER — Ambulatory Visit (HOSPITAL_COMMUNITY): Payer: Self-pay | Admitting: Family Medicine

## 2024-03-27 ENCOUNTER — Ambulatory Visit (HOSPITAL_COMMUNITY)
Admission: RE | Admit: 2024-03-27 | Discharge: 2024-03-27 | Disposition: A | Discharge status: Home / Self Care | Payer: 59 | Source: Ambulatory Visit | Attending: Family Medicine | Admitting: Family Medicine

## 2024-03-27 VITALS — BP 138/86 | HR 65 | Ht 79.0 in | Wt 331.2 lb

## 2024-03-27 DIAGNOSIS — Z716 Tobacco abuse counseling: Secondary | ICD-10-CM | POA: Insufficient documentation

## 2024-03-27 DIAGNOSIS — Z7984 Long term (current) use of oral hypoglycemic drugs: Secondary | ICD-10-CM | POA: Diagnosis not present

## 2024-03-27 DIAGNOSIS — I110 Hypertensive heart disease with heart failure: Secondary | ICD-10-CM | POA: Insufficient documentation

## 2024-03-27 DIAGNOSIS — Z79899 Other long term (current) drug therapy: Secondary | ICD-10-CM | POA: Diagnosis not present

## 2024-03-27 DIAGNOSIS — I10 Essential (primary) hypertension: Secondary | ICD-10-CM

## 2024-03-27 DIAGNOSIS — Z8249 Family history of ischemic heart disease and other diseases of the circulatory system: Secondary | ICD-10-CM | POA: Insufficient documentation

## 2024-03-27 DIAGNOSIS — G4733 Obstructive sleep apnea (adult) (pediatric): Secondary | ICD-10-CM | POA: Diagnosis not present

## 2024-03-27 DIAGNOSIS — I5022 Chronic systolic (congestive) heart failure: Secondary | ICD-10-CM | POA: Insufficient documentation

## 2024-03-27 DIAGNOSIS — F17200 Nicotine dependence, unspecified, uncomplicated: Secondary | ICD-10-CM | POA: Diagnosis not present

## 2024-03-27 DIAGNOSIS — Z6837 Body mass index (BMI) 37.0-37.9, adult: Secondary | ICD-10-CM | POA: Insufficient documentation

## 2024-03-27 DIAGNOSIS — I428 Other cardiomyopathies: Secondary | ICD-10-CM | POA: Diagnosis not present

## 2024-03-27 DIAGNOSIS — E669 Obesity, unspecified: Secondary | ICD-10-CM | POA: Diagnosis not present

## 2024-03-27 LAB — BRAIN NATRIURETIC PEPTIDE: B Natriuretic Peptide: 101.7 pg/mL — ABNORMAL HIGH (ref 0.0–100.0)

## 2024-03-27 LAB — BASIC METABOLIC PANEL WITH GFR
Anion gap: 5 (ref 5–15)
BUN: 10 mg/dL (ref 6–20)
CO2: 26 mmol/L (ref 22–32)
Calcium: 9.1 mg/dL (ref 8.9–10.3)
Chloride: 105 mmol/L (ref 98–111)
Creatinine, Ser: 0.85 mg/dL (ref 0.61–1.24)
GFR, Estimated: >60 mL/min (ref >60)
Glucose, Bld: 95 mg/dL (ref 70–99)
Potassium: 3.9 mmol/L (ref 3.5–5.1)
Sodium: 136 mmol/L (ref 135–145)

## 2024-03-27 NOTE — Patient Instructions (Signed)
 No change in medications. Labs today - will call you if abnormal. Return to see Dr. Rolan in 6 months. CALL [phone] IN FEBRUARY TO SCHEDULE THIS APPOINTMENT.  Please call us  at [phone] if any questions or concerns prior to your next visit.

## 2024-03-28 ENCOUNTER — Ambulatory Visit: Attending: Internal Medicine | Admitting: *Deleted

## 2024-03-28 DIAGNOSIS — I5022 Chronic systolic (congestive) heart failure: Secondary | ICD-10-CM | POA: Diagnosis not present

## 2024-03-28 NOTE — Patient Instructions (Signed)
   After Your ICD (Implantable Cardiac Defibrillator)    Monitor your defibrillator site for redness, swelling, and drainage. Call the device clinic at [phone] if you experience these symptoms or fever/chills.  Your incision was closed with Steri-strips or staples:  You may shower 7 days after your procedure and wash your incision with soap and water. Avoid lotions, ointments, or perfumes over your incision until it is well-healed.  You may use a hot tub or a pool after your wound check appointment if the incision is completely closed.   There are no other restrictions in arm movement after your wound check appointment.  Your ICD is designed to protect you from life threatening heart rhythms. Because of this, you may receive a shock.   1 shock with no symptoms:  Call the office during business hours. 1 shock with symptoms (chest pain, chest pressure, dizziness, lightheadedness, shortness of breath, overall feeling unwell):  Call 911. If you experience 2 or more shocks in 24 hours:  Call 911. If you receive a shock, you should not drive.  [place] DMV - no driving for 6 months if you receive appropriate therapy from your ICD.   ICD Alerts:  Some alerts are vibratory and others beep. These are NOT emergencies. Please call our office to let us know. If this occurs at night or on weekends, it can wait until the next business day. Send a remote transmission.  If your device is capable of reading fluid status (for heart failure), you will be offered monthly monitoring to review this with you.   Remote monitoring is used to monitor your ICD from home. This monitoring is scheduled every 91 days by our office. It allows [place] to keep an eye on the functioning of your device to ensure it is working properly. You will routinely see your Electrophysiologist annually (more often if necessary).

## 2024-03-28 NOTE — Progress Notes (Signed)
 Normal multi-lead ICD wound check. Wound well healed. Presenting rhythm: AS/BiVP. Routine testing performed. Thresholds, sensing, and impedance consistent with implant measurements with 3.5V safety margin/auto capture until 3 month visit. No episodes. No treated arrhythmias. No further arm restrictions since this was just a generator change out. Reviewed shock plan.  Pt enrolled in remote follow-up. Battery Longevity: 7.8 - 8.3 yrs. Effective BiVP: 99%. LV lead impedance values are chronically high and stable.

## 2024-04-04 ENCOUNTER — Encounter

## 2024-04-04 ENCOUNTER — Encounter: Payer: Self-pay | Admitting: Family Medicine

## 2024-04-29 NOTE — Progress Notes (Signed)
 Remote ICD Transmission

## 2024-05-10 NOTE — Progress Notes (Signed)
 Remote ICD Transmission

## 2024-05-12 ENCOUNTER — Other Ambulatory Visit (HOSPITAL_COMMUNITY): Payer: Self-pay

## 2024-05-21 ENCOUNTER — Ambulatory Visit: Payer: Self-pay

## 2024-05-30 ENCOUNTER — Other Ambulatory Visit (HOSPITAL_COMMUNITY): Payer: Self-pay

## 2024-06-12 ENCOUNTER — Other Ambulatory Visit (HOSPITAL_COMMUNITY): Payer: Self-pay

## 2024-06-14 ENCOUNTER — Encounter: Admitting: Cardiology

## 2024-06-14 ENCOUNTER — Ambulatory Visit (INDEPENDENT_AMBULATORY_CARE_PROVIDER_SITE_OTHER)

## 2024-06-14 DIAGNOSIS — I5022 Chronic systolic (congestive) heart failure: Secondary | ICD-10-CM

## 2024-06-19 ENCOUNTER — Ambulatory Visit: Payer: Self-pay | Admitting: Cardiology

## 2024-06-19 LAB — CUP PACEART REMOTE DEVICE CHECK
Battery Remaining Longevity: 95 mo
Battery Remaining Percentage: 95 %
Battery Voltage: 3.04 V
Brady Statistic AP VP Percent: 9.8 %
Brady Statistic AP VS Percent: 1 %
Brady Statistic AS VP Percent: 90 %
Brady Statistic AS VS Percent: 1 %
Brady Statistic RA Percent Paced: 9.5 %
Date Time Interrogation Session: 20251118133844
HighPow Impedance: 86 Ohm
Implantable Lead Connection Status: 753985
Implantable Lead Connection Status: 753985
Implantable Lead Connection Status: 753985
Implantable Lead Implant Date: 20170512
Implantable Lead Implant Date: 20170512
Implantable Lead Implant Date: 20170512
Implantable Lead Location: 753858
Implantable Lead Location: 753859
Implantable Lead Location: 753860
Implantable Lead Model: 7122
Implantable Pulse Generator Implant Date: 20250814
Lead Channel Impedance Value: 1275 Ohm
Lead Channel Impedance Value: 440 Ohm
Lead Channel Impedance Value: 600 Ohm
Lead Channel Pacing Threshold Amplitude: 0.75 V
Lead Channel Pacing Threshold Amplitude: 0.875 V
Lead Channel Pacing Threshold Amplitude: 1.125 V
Lead Channel Pacing Threshold Pulse Width: 0.5 ms
Lead Channel Pacing Threshold Pulse Width: 0.5 ms
Lead Channel Pacing Threshold Pulse Width: 1 ms
Lead Channel Sensing Intrinsic Amplitude: 11.8 mV
Lead Channel Sensing Intrinsic Amplitude: 5 mV
Lead Channel Setting Pacing Amplitude: 1.375
Lead Channel Setting Pacing Amplitude: 2.125
Lead Channel Setting Pacing Amplitude: 2.5 V
Lead Channel Setting Pacing Pulse Width: 0.5 ms
Lead Channel Setting Pacing Pulse Width: 1 ms
Lead Channel Setting Sensing Sensitivity: 0.5 mV
Pulse Gen Serial Number: 211054305

## 2024-06-19 NOTE — Progress Notes (Signed)
 Remote PPM Transmission

## 2024-06-25 NOTE — Progress Notes (Signed)
 " Electrophysiology Office Note:   Date:  06/26/2024  ID:  Ethan York, DOB August 04, 1975, MRN [id number]  Primary Cardiologist: None Electrophysiologist: Fonda Kitty, MD      History of Present Illness:   Ethan York is a 48 y.o. male with h/o HTN, OSA, chronic systolic heart failure secondary to non-ischemic cardiomyopathy and LBBB s/p CRT-D in 11/2015 who is being seen today for follow up of his device.    Discussed the use of AI scribe software for clinical note transcription with the patient, who gave verbal consent to proceed.  History of Present Illness Ethan York is a 48 year old male with a defibrillator who presents for a routine three-month follow-up after battery replacement. He underwent generator change in 03/14/24.  He feels good and has no concerns regarding his device. The site of the battery replacement has healed well.  There have been no episodes of arrhythmias, including atrial fibrillation, since the battery replacement.  No symptoms such as trouble breathing or fluid retention in the legs. He has not experienced any heart-related issues requiring hospitalization recently, although he did mention a recent hospital visit for bleeding, unrelated to his heart condition.  He plans to travel to [facility] for the [employer] and some leisure activities.   Review of systems complete and found to be negative unless listed in HPI.  EP Information / Studies Reviewed:    EKG is not ordered today. EKG from 09/27/23 reviewed which showed AS and BiV paced.      EKG 06/18/2015 (pre CRT): LBBB   Echo 10/07/21:  1. Left ventricular ejection fraction, by estimation, is 45%. The left  ventricle has mildly decreased function. The left ventricle demonstrates  global hypokinesis. There is mild left ventricular hypertrophy. Left  ventricular diastolic parameters are  consistent with Grade I diastolic dysfunction (impaired relaxation).   2. Right  ventricular systolic function is normal. The right ventricular  size is normal. Tricuspid regurgitation signal is inadequate for assessing  PA pressure.   3. The mitral valve is normal in structure. No evidence of mitral valve  regurgitation. No evidence of mitral stenosis.   4. The aortic valve is tricuspid. Aortic valve regurgitation is not  visualized. No aortic stenosis is present.   5. Aortic dilatation noted. There is mild dilatation of the ascending  aorta, measuring 38 mm.   6. The inferior vena cava is dilated in size with >50% respiratory  variability, suggesting right atrial pressure of 8 mmHg.   Physical Exam:   VS:  BP 138/88   Pulse 64   Ht 6' 7 (2.007 m)   Wt (!) 345 lb 3.2 oz (156.6 kg)   SpO2 96%   BMI 38.89 kg/m    Wt Readings from Last 3 Encounters:  06/26/24 (!) 345 lb 3.2 oz (156.6 kg)  03/27/24 (!) 331 lb 3.2 oz (150.2 kg)  03/14/24 (!) 330 lb (149.7 kg)     GEN: Well nourished, well developed in no acute distress NECK: No JVD CARDIAC: Normal rate, regular rhythm, well healed left chest ICD pocket RESPIRATORY:  Clear to auscultation without rales, wheezing or rhonchi  ABDOMEN: Soft, non-distended EXTREMITIES:  Trace edema bilaterally; No deformity   ASSESSMENT AND PLAN:    #. S/p CRT-D: Placed for low EF and LBBB. Significant improvement in EF.  Underwent generator change on 03/14/2024.  Incision has healed well.  No obvious complications. - In clinic device interrogation was performed. Appropriate function and stable lead parameters.  -  Continue remote monitoring.  #. Chronic systolic heart failure: Appears relatively well compensated.  #. NICM: LBBB - Continue GDMT regimen of carvedilol  25mg  BID, empagliflozin  10mg  daily, Entresto  97-103mg  BID, spironolactone  50mg  daily. - Follow up with our HF team, Dr. Rolan.    Follow up with EP Team in 12 months  Signed, Fonda Kitty, MD  "

## 2024-06-26 ENCOUNTER — Ambulatory Visit: Attending: Internal Medicine | Admitting: Cardiology

## 2024-06-26 ENCOUNTER — Encounter: Payer: Self-pay | Admitting: Cardiology

## 2024-06-26 VITALS — BP 138/88 | HR 64 | Ht 79.0 in | Wt 345.2 lb

## 2024-06-26 DIAGNOSIS — I5022 Chronic systolic (congestive) heart failure: Secondary | ICD-10-CM | POA: Diagnosis not present

## 2024-06-26 DIAGNOSIS — I447 Left bundle-branch block, unspecified: Secondary | ICD-10-CM

## 2024-06-26 DIAGNOSIS — Z95810 Presence of automatic (implantable) cardiac defibrillator: Secondary | ICD-10-CM

## 2024-06-26 DIAGNOSIS — I428 Other cardiomyopathies: Secondary | ICD-10-CM | POA: Diagnosis not present

## 2024-06-26 NOTE — Patient Instructions (Signed)
 Medication Instructions:  The current medical regimen is effective;  continue present plan and medications.  *If you need a refill on your cardiac medications before your next appointment, please call your pharmacy*  Follow-Up: At [facility], you and your health needs are our priority.  As part of our continuing mission to provide you with exceptional heart care, our providers are all part of one team.  This team includes your primary Cardiologist (physician) and Advanced Practice Providers or APPs (Physician Assistants and Nurse Practitioners) who all work together to provide you with the care you need, when you need it.  Your next appointment:   1 year(s)  Provider:   Dr Kennyth      We recommend signing up for the patient portal called MyChart.  Sign up information is provided on this After Visit Summary.  MyChart is used to connect with patients for Virtual Visits (Telemedicine).  Patients are able to view lab/test results, encounter notes, upcoming appointments, etc.  Non-urgent messages can be sent to your provider as well.   To learn more about what you can do with MyChart, go to [email or web address].

## 2024-07-18 ENCOUNTER — Other Ambulatory Visit (HOSPITAL_COMMUNITY): Payer: Self-pay

## 2024-07-18 ENCOUNTER — Other Ambulatory Visit (HOSPITAL_COMMUNITY): Payer: Self-pay | Admitting: Family Medicine

## 2024-07-18 DIAGNOSIS — I5022 Chronic systolic (congestive) heart failure: Secondary | ICD-10-CM

## 2024-07-18 MED ORDER — EMPAGLIFLOZIN 10 MG PO TABS
10.0000 mg | ORAL_TABLET | Freq: Every day | ORAL | 3 refills | Status: AC
  Filled 2024-07-18: qty 30, 30d supply, fill #0

## 2024-07-19 ENCOUNTER — Other Ambulatory Visit (HOSPITAL_COMMUNITY): Payer: Self-pay | Admitting: Family Medicine

## 2024-07-19 ENCOUNTER — Other Ambulatory Visit (HOSPITAL_COMMUNITY): Payer: Self-pay | Admitting: Cardiology

## 2024-07-19 DIAGNOSIS — E785 Hyperlipidemia, unspecified: Secondary | ICD-10-CM

## 2024-07-19 DIAGNOSIS — I5022 Chronic systolic (congestive) heart failure: Secondary | ICD-10-CM

## 2024-09-11 ENCOUNTER — Ambulatory Visit (HOSPITAL_COMMUNITY): Admitting: Cardiology

## 2024-09-13 ENCOUNTER — Encounter

## 2024-12-13 ENCOUNTER — Encounter

## 2025-03-14 ENCOUNTER — Encounter

## 2025-06-13 ENCOUNTER — Encounter

## 2025-09-12 ENCOUNTER — Encounter
